# Patient Record
Sex: Female | Born: 1995 | Race: Black or African American | Hispanic: No | Marital: Single | Smoking: Never smoker
Health system: Southern US, Community
[De-identification: ages and names within clinical notes are randomized; demographics above are authoritative.]

## PROBLEM LIST (undated history)

## (undated) DIAGNOSIS — J069 Acute upper respiratory infection, unspecified: Secondary | ICD-10-CM

## (undated) DIAGNOSIS — F32A Depression, unspecified: Secondary | ICD-10-CM

## (undated) DIAGNOSIS — L309 Dermatitis, unspecified: Secondary | ICD-10-CM

## (undated) DIAGNOSIS — T783XXA Angioneurotic edema, initial encounter: Secondary | ICD-10-CM

## (undated) DIAGNOSIS — J329 Chronic sinusitis, unspecified: Secondary | ICD-10-CM

## (undated) DIAGNOSIS — L509 Urticaria, unspecified: Secondary | ICD-10-CM

## (undated) DIAGNOSIS — Z889 Allergy status to unspecified drugs, medicaments and biological substances status: Secondary | ICD-10-CM

## (undated) DIAGNOSIS — Z87820 Personal history of traumatic brain injury: Secondary | ICD-10-CM

## (undated) DIAGNOSIS — R55 Syncope and collapse: Secondary | ICD-10-CM

## (undated) DIAGNOSIS — F329 Major depressive disorder, single episode, unspecified: Secondary | ICD-10-CM

## (undated) DIAGNOSIS — G43909 Migraine, unspecified, not intractable, without status migrainosus: Secondary | ICD-10-CM

## (undated) HISTORY — DX: Urticaria, unspecified: L50.9

## (undated) HISTORY — PX: NO PAST SURGERIES: SHX2092

## (undated) HISTORY — DX: Acute upper respiratory infection, unspecified: J06.9

## (undated) HISTORY — DX: Major depressive disorder, single episode, unspecified: F32.9

## (undated) HISTORY — DX: Migraine, unspecified, not intractable, without status migrainosus: G43.909

## (undated) HISTORY — DX: Syncope and collapse: R55

## (undated) HISTORY — DX: Angioneurotic edema, initial encounter: T78.3XXA

## (undated) HISTORY — DX: Personal history of traumatic brain injury: Z87.820

## (undated) HISTORY — DX: Depression, unspecified: F32.A

## (undated) HISTORY — DX: Dermatitis, unspecified: L30.9

---
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
## 2014-07-24 ENCOUNTER — Emergency Department (HOSPITAL_BASED_OUTPATIENT_CLINIC_OR_DEPARTMENT_OTHER)
Admission: EM | Admit: 2014-07-24 | Discharge: 2014-07-24 | Disposition: A | Discharge status: Home / Self Care | Attending: Emergency Medicine | Admitting: Emergency Medicine

## 2014-07-24 ENCOUNTER — Encounter (HOSPITAL_BASED_OUTPATIENT_CLINIC_OR_DEPARTMENT_OTHER): Payer: Self-pay | Admitting: *Deleted

## 2014-07-24 DIAGNOSIS — R05 Cough: Secondary | ICD-10-CM | POA: Diagnosis present

## 2014-07-24 DIAGNOSIS — Z7951 Long term (current) use of inhaled steroids: Secondary | ICD-10-CM | POA: Insufficient documentation

## 2014-07-24 DIAGNOSIS — Z792 Long term (current) use of antibiotics: Secondary | ICD-10-CM | POA: Insufficient documentation

## 2014-07-24 DIAGNOSIS — J45909 Unspecified asthma, uncomplicated: Secondary | ICD-10-CM | POA: Insufficient documentation

## 2014-07-24 DIAGNOSIS — Z79899 Other long term (current) drug therapy: Secondary | ICD-10-CM | POA: Insufficient documentation

## 2014-07-24 DIAGNOSIS — H65192 Other acute nonsuppurative otitis media, left ear: Secondary | ICD-10-CM | POA: Insufficient documentation

## 2014-07-24 MED ORDER — AMOXICILLIN 500 MG PO CAPS
500.0000 mg | ORAL_CAPSULE | Freq: Three times a day (TID) | ORAL | Status: AC
Start: 2014-07-24 — End: 2014-08-03

## 2014-07-24 NOTE — Discharge Instructions (Signed)

## 2014-07-24 NOTE — ED Notes (Signed)
Woke with pain in her left ear. Sore throat.

## 2014-07-24 NOTE — ED Provider Notes (Signed)
CSN: [id number]     Arrival date & time 07/24/14  1110 History   First MD Initiated Contact with Patient 07/24/14 1155     Chief Complaint  Patient presents with  . URI     (Consider location/radiation/quality/duration/timing/severity/associated sxs/prior Treatment) Patient is a 18 y.o. female presenting with URI. The history is provided by the patient. No language interpreter was used.  URI Presenting symptoms: congestion and cough   Severity:  Moderate Onset quality:  Gradual Duration:  1 day Timing:  Constant Progression:  Worsening Chronicity:  New Relieved by:  Nothing Worsened by:  Nothing tried Ineffective treatments:  None tried Associated symptoms: no sinus pain   Risk factors: no chronic respiratory disease     Past Medical History  Diagnosis Date  . Asthma   . Multiple allergies   . Sinusitis    History reviewed. No pertinent past surgical history. No family history on file. History  Substance Use Topics  . Smoking status: Never Smoker   . Smokeless tobacco: Not on file  . Alcohol Use: No   OB History    No data available     Review of Systems  HENT: Positive for congestion.   Respiratory: Positive for cough.   All other systems reviewed and are negative.     Allergies  Motrin and Other  Home Medications   Prior to Admission medications   Medication Sig Start Date End Date Taking? Authorizing Provider  albuterol (PROVENTIL HFA;VENTOLIN HFA) 108 (90 BASE) MCG/ACT inhaler Inhale 2 puffs into the lungs every 6 (six) hours as needed. For shortness of breath and wheezing    Historical Provider, MD  Alcaftadine (LASTACAFT) 0.25 % SOLN Apply 1 drop to eye daily as needed. Patient uses this medication for allergies.    Historical Provider, MD  amoxicillin (AMOXIL) 500 MG capsule Take 1 capsule (500 mg total) by mouth 3 (three) times daily. 07/24/14 08/03/14  Elson AreasLeslie K Sofia, PA-C  azithromycin [facility](ZITHROMAX) 250 MG tablet Take one tablet per day until gone  08/08/12   Emilia BeckKaitlyn Szekalski, PA-C  cetirizine (ZYRTEC) 10 MG tablet Take 10 mg by mouth daily.    Historical Provider, MD  DiphenhydrAMINE HCl (BENADRYL ALLERGY PO) Take 2 capsules by mouth daily as needed. Patient used this for an allergic reaction to something.    Historical Provider, MD  FLUoxetine (PROZAC) 10 MG capsule Take 10 mg by mouth daily.    Historical Provider, MD  fluticasone (FLONASE) 50 MCG/ACT nasal spray Place 2 sprays into the nose daily.    Historical Provider, MD  olopatadine (PATANOL) 0.1 % ophthalmic solution Place 1 drop into the right eye 2 (two) times daily. Patients mom states that she had an allergic reaction to the medication.  Medicine was prescribed for the patient.    Historical Provider, MD   BP 118/53 mmHg  Pulse 55  Temp(Src) 97.7 F (36.5 C) (Oral)  Resp 20  Ht 4\' 11"  (1.499 m)  Wt 105 lb (47.628 kg)  BMI 21.20 kg/m2  SpO2 99% Physical Exam  Constitutional: She is oriented to person, place, and time. She appears well-developed and well-nourished.  HENT:  Head: Normocephalic.  Right Ear: External ear normal.  Nose: Nose normal.  Mouth/Throat: Oropharynx is clear and moist.  Left tm erythematous bulging  Eyes: Conjunctivae are normal. Pupils are equal, round, and reactive to light.  Neck: Normal range of motion.  Cardiovascular: Normal rate.   Pulmonary/Chest: Effort normal and breath sounds normal.  Abdominal: Soft.  Neurological:  She is alert and oriented to person, place, and time. She has normal reflexes.  Skin: Skin is warm.  Psychiatric: She has a normal mood and affect.  Nursing note and vitals reviewed.   ED Course  Procedures (including critical care time) Labs Review Labs Reviewed - No data to display  Imaging Review No results found.   EKG Interpretation None      MDM   Final diagnoses:  Acute nonsuppurative otitis media of left ear    amoxicillian 500 mg 30 tid    Elson AreasLeslie K Sofia, PA-C 07/24/14 1220  Ethelda ChickMartha K  Linker, MD 07/24/14 1311

## 2015-01-24 ENCOUNTER — Emergency Department (HOSPITAL_BASED_OUTPATIENT_CLINIC_OR_DEPARTMENT_OTHER)

## 2015-01-24 ENCOUNTER — Emergency Department (HOSPITAL_BASED_OUTPATIENT_CLINIC_OR_DEPARTMENT_OTHER)
Admission: EM | Admit: 2015-01-24 | Discharge: 2015-01-24 | Disposition: A | Discharge status: Home / Self Care | Attending: Emergency Medicine | Admitting: Emergency Medicine

## 2015-01-24 ENCOUNTER — Encounter (HOSPITAL_BASED_OUTPATIENT_CLINIC_OR_DEPARTMENT_OTHER): Payer: Self-pay | Admitting: Emergency Medicine

## 2015-01-24 DIAGNOSIS — W182XXA Fall in (into) shower or empty bathtub, initial encounter: Secondary | ICD-10-CM | POA: Insufficient documentation

## 2015-01-24 DIAGNOSIS — R55 Syncope and collapse: Secondary | ICD-10-CM

## 2015-01-24 DIAGNOSIS — Y998 Other external cause status: Secondary | ICD-10-CM | POA: Insufficient documentation

## 2015-01-24 DIAGNOSIS — Y9289 Other specified places as the place of occurrence of the external cause: Secondary | ICD-10-CM | POA: Diagnosis not present

## 2015-01-24 DIAGNOSIS — Y9389 Activity, other specified: Secondary | ICD-10-CM | POA: Insufficient documentation

## 2015-01-24 DIAGNOSIS — W01198A Fall on same level from slipping, tripping and stumbling with subsequent striking against other object, initial encounter: Secondary | ICD-10-CM | POA: Insufficient documentation

## 2015-01-24 DIAGNOSIS — Z7951 Long term (current) use of inhaled steroids: Secondary | ICD-10-CM | POA: Insufficient documentation

## 2015-01-24 DIAGNOSIS — Z79899 Other long term (current) drug therapy: Secondary | ICD-10-CM | POA: Diagnosis not present

## 2015-01-24 DIAGNOSIS — S0990XA Unspecified injury of head, initial encounter: Secondary | ICD-10-CM | POA: Insufficient documentation

## 2015-01-24 DIAGNOSIS — J45909 Unspecified asthma, uncomplicated: Secondary | ICD-10-CM | POA: Insufficient documentation

## 2015-01-24 DIAGNOSIS — Z8679 Personal history of other diseases of the circulatory system: Secondary | ICD-10-CM | POA: Diagnosis not present

## 2015-01-24 LAB — BASIC METABOLIC PANEL
ANION GAP: 9 (ref 5–15)
BUN: 15 mg/dL (ref 6–20)
CALCIUM: 9.9 mg/dL (ref 8.9–10.3)
CO2: 24 mmol/L (ref 22–32)
Chloride: 105 mmol/L (ref 101–111)
Creatinine, Ser: 0.8 mg/dL (ref 0.44–1.00)
Glucose, Bld: 94 mg/dL (ref 65–99)
Potassium: 4 mmol/L (ref 3.5–5.1)
SODIUM: 138 mmol/L (ref 135–145)

## 2015-01-24 LAB — CBC WITH DIFFERENTIAL/PLATELET
BASOS ABS: 0 10*3/uL (ref 0.0–0.1)
BASOS PCT: 0 % (ref 0–1)
Eosinophils Absolute: 0.4 10*3/uL (ref 0.0–0.7)
Eosinophils Relative: 8 % — ABNORMAL HIGH (ref 0–5)
HEMATOCRIT: 41 % (ref 36.0–46.0)
Hemoglobin: 13.6 g/dL (ref 12.0–15.0)
LYMPHS PCT: 37 % (ref 12–46)
Lymphs Abs: 2 10*3/uL (ref 0.7–4.0)
MCH: 30 pg (ref 26.0–34.0)
MCHC: 33.2 g/dL (ref 30.0–36.0)
MCV: 90.3 fL (ref 78.0–100.0)
MONO ABS: 0.6 10*3/uL (ref 0.1–1.0)
MONOS PCT: 11 % (ref 3–12)
Neutro Abs: 2.4 10*3/uL (ref 1.7–7.7)
Neutrophils Relative %: 44 % (ref 43–77)
PLATELETS: 283 10*3/uL (ref 150–400)
RBC: 4.54 MIL/uL (ref 3.87–5.11)
RDW: 12.4 % (ref 11.5–15.5)
WBC: 5.5 10*3/uL (ref 4.0–10.5)

## 2015-01-24 NOTE — Discharge Instructions (Signed)
Follow-up with your neurologist if your symptoms persist.   Syncope Syncope is a medical term for fainting or passing out. This means you lose consciousness and drop to the ground. People are generally unconscious for less than 5 minutes. You may have some muscle twitches for up to 15 seconds before waking up and returning to normal. Syncope occurs more often in older adults, but it can happen to anyone. While most causes of syncope are not dangerous, syncope can be a sign of a serious medical problem. It is important to seek medical care.  CAUSES  Syncope is caused by a sudden drop in blood flow to the brain. The specific cause is often not determined. Factors that can bring on syncope include:  Taking medicines that lower blood pressure.  Sudden changes in posture, such as standing up quickly.  Taking more medicine than prescribed.  Standing in one place for too long.  Seizure disorders.  Dehydration and excessive exposure to heat.  Low blood sugar (hypoglycemia).  Straining to have a bowel movement.  Heart disease, irregular heartbeat, or other circulatory problems.  Fear, emotional distress, seeing blood, or severe pain. SYMPTOMS  Right before fainting, you may:  Feel dizzy or light-headed.  Feel nauseous.  See all white or all black in your field of vision.  Have cold, clammy skin. DIAGNOSIS  Your health care provider will ask about your symptoms, perform a physical exam, and perform an electrocardiogram (ECG) to record the electrical activity of your heart. Your health care provider may also perform other heart or blood tests to determine the cause of your syncope which may include:  Transthoracic echocardiogram (TTE). During echocardiography, sound waves are used to evaluate how blood flows through your heart.  Transesophageal echocardiogram (TEE).  Cardiac monitoring. This allows your health care provider to monitor your heart rate and rhythm in real  time.  Holter monitor. This is a portable device that records your heartbeat and can help diagnose heart arrhythmias. It allows your health care provider to track your heart activity for several days, if needed.  Stress tests by exercise or by giving medicine that makes the heart beat faster. TREATMENT  In most cases, no treatment is needed. Depending on the cause of your syncope, your health care provider may recommend changing or stopping some of your medicines. HOME CARE INSTRUCTIONS  Have someone stay with you until you feel stable.  Do not drive, use machinery, or play sports until your health care provider says it is okay.  Keep all follow-up appointments as directed by your health care provider.  Lie down right away if you start feeling like you might faint. Breathe deeply and steadily. Wait until all the symptoms have passed.  Drink enough fluids to keep your urine clear or pale yellow.  If you are taking blood pressure or heart medicine, get up slowly and take several minutes to sit and then stand. This can reduce dizziness. SEEK IMMEDIATE MEDICAL CARE IF:   You have a severe headache.  You have unusual pain in the chest, abdomen, or back.  You are bleeding from your mouth or rectum, or you have black or tarry stool.  You have an irregular or very fast heartbeat.  You have pain with breathing.  You have repeated fainting or seizure-like jerking during an episode.  You faint when sitting or lying down.  You have confusion.  You have trouble walking.  You have severe weakness.  You have vision problems. If you fainted, call  your local emergency services (911 in [place] Do not drive yourself to the hospital.  MAKE SURE YOU:  Understand these instructions.  Will watch your condition.  Will get help right away if you are not doing well or get worse. Document Released: 08/17/2005 Document Revised: 08/22/2013 Document Reviewed: 10/16/2011 [facility] Patient  Information 2015 [employer] [place]. This information is not intended to replace advice given to you by your health care provider. Make sure you discuss any questions you have with your health care provider.  Head Injury You have received a head injury. It does not appear serious at this time. Headaches and vomiting are common following head injury. It should be easy to awaken from sleeping. Sometimes it is necessary for you to stay in the emergency department for a while for observation. Sometimes admission to the hospital may be needed. After injuries such as yours, most problems occur within the first 24 hours, but side effects may occur up to 7-10 days after the injury. It is important for you to carefully monitor your condition and contact your health care provider or seek immediate medical care if there is a change in your condition. WHAT ARE THE TYPES OF HEAD INJURIES? Head injuries can be as minor as a bump. Some head injuries can be more severe. More severe head injuries include:  A jarring injury to the brain (concussion).  A bruise of the brain (contusion). This mean there is bleeding in the brain that can cause swelling.  A cracked skull (skull fracture).  Bleeding in the brain that collects, clots, and forms a bump (hematoma). WHAT CAUSES A HEAD INJURY? A serious head injury is most likely to happen to someone who is in a car wreck and is not wearing a seat belt. Other causes of major head injuries include bicycle or motorcycle accidents, sports injuries, and falls. HOW ARE HEAD INJURIES DIAGNOSED? A complete history of the event leading to the injury and your current symptoms will be helpful in diagnosing head injuries. Many times, pictures of the brain, such as CT or MRI are needed to see the extent of the injury. Often, an overnight hospital stay is necessary for observation.  WHEN SHOULD I SEEK IMMEDIATE MEDICAL CARE?  You should get help right away if:  You have confusion or  drowsiness.  You feel sick to your stomach (nauseous) or have continued, forceful vomiting.  You have dizziness or unsteadiness that is getting worse.  You have severe, continued headaches not relieved by medicine. Only take over-the-counter or prescription medicines for pain, fever, or discomfort as directed by your health care provider.  You do not have normal function of the arms or legs or are unable to walk.  You notice changes in the black spots in the center of the colored part of your eye (pupil).  You have a clear or bloody fluid coming from your nose or ears.  You have a loss of vision. During the next 24 hours after the injury, you must stay with someone who can watch you for the warning signs. This person should contact local emergency services (911 in the [place] if you have seizures, you become unconscious, or you are unable to wake up. HOW CAN I PREVENT A HEAD INJURY IN THE FUTURE? The most important factor for preventing major head injuries is avoiding motor vehicle accidents. To minimize the potential for damage to your head, it is crucial to wear seat belts while riding in motor vehicles. Wearing helmets while bike riding and  and playing collision sports (like football) is also helpful. Also, avoiding dangerous activities around the house will further help reduce your risk of head injury.  °WHEN CAN I RETURN TO NORMAL ACTIVITIES AND ATHLETICS? °You should be reevaluated by your health care provider before returning to these activities. If you have any of the following symptoms, you should not return to activities or contact sports until 1 week after the symptoms have stopped: °· Persistent headache. °· Dizziness or vertigo. °· Poor attention and concentration. °· Confusion. °· Memory problems. °· Nausea or vomiting. °· Fatigue or tire easily. °· Irritability. °· Intolerant of bright lights or loud noises. °· Anxiety or depression. °· Disturbed sleep. °MAKE SURE YOU:  °· Understand these  instructions. °· Will watch your condition. °· Will get help right away if you are not doing well or get worse. °Document Released: 08/17/2005 Document Revised: 08/22/2013 Document Reviewed: 04/24/2013 °[employer]® Patient Information ©2015 [employer]. This information is not intended to replace advice given to you by your health care provider. Make sure you discuss any questions you have with your health care provider. ° °

## 2015-01-24 NOTE — ED Notes (Signed)
Pt states she has gotten dizzy the last Tuesday and had to get out of shower then.

## 2015-01-24 NOTE — ED Provider Notes (Signed)
CSN: [id number]     Arrival date & time 01/24/15  2056 History  This chart was scribed for Geoffery Lyons, MD by Annye Asa, ED Scribe. This patient was seen in room MH02/MH02 and the patient's care was started at 9:18 PM.    Chief Complaint  Patient presents with  . Fall   The history is provided by the patient. No language interpreter was used.     HPI Comments: Angelica Edwards is a 19 y.o. female with past medical history of migraines who presents to the Emergency Department complaining of fall. Patient explains she stepped out of the shower tonight and "blacked out," falling and hit the right side of her head; when she returned to consciousness, she noted blurred vision that resolved with time. This fall was unwitnessed. She states she felt well beforehand. Patient reports this has been a recurrent issue over the past several years - she estimates she has had 10 such episodes. She complains of a migraine at present. She denies biting any part of her mouth, neck pain.   Past Medical History  Diagnosis Date  . Asthma   . Multiple allergies   . Sinusitis    History reviewed. No pertinent past surgical history. History reviewed. No pertinent family history. History  Substance Use Topics  . Smoking status: Never Smoker   . Smokeless tobacco: Not on file  . Alcohol Use: No   OB History    No data available     Review of Systems  A complete 10 system review of systems was obtained and all systems are negative except as noted in the HPI and PMH.    Allergies  Motrin and Other  Home Medications   Prior to Admission medications   Medication Sig Start Date End Date Taking? Authorizing Provider  albuterol (PROVENTIL HFA;VENTOLIN HFA) 108 (90 BASE) MCG/ACT inhaler Inhale 2 puffs into the lungs every 6 (six) hours as needed. For shortness of breath and wheezing    Historical Provider, MD  Alcaftadine (LASTACAFT) 0.25 % SOLN Apply 1 drop to eye daily as needed. Patient uses this  medication for allergies.    Historical Provider, MD  azithromycin (ZITHROMAX) 250 MG tablet Take one tablet per day until gone 08/08/12   Emilia Beck, PA-C  cetirizine (ZYRTEC) 10 MG tablet Take 10 mg by mouth daily.    Historical Provider, MD  DiphenhydrAMINE HCl (BENADRYL ALLERGY PO) Take 2 capsules by mouth daily as needed. Patient used this for an allergic reaction to something.    Historical Provider, MD  FLUoxetine (PROZAC) 10 MG capsule Take 10 mg by mouth daily.    Historical Provider, MD  fluticasone (FLONASE) 50 MCG/ACT nasal spray Place 2 sprays into the nose daily.    Historical Provider, MD  olopatadine (PATANOL) 0.1 % ophthalmic solution Place 1 drop into the right eye 2 (two) times daily. Patients mom states that she had an allergic reaction to the medication.  Medicine was prescribed for the patient.    Historical Provider, MD   BP 119/69 mmHg  Pulse 69  Temp(Src) 98.3 F (36.8 C) (Oral)  Resp 18  Ht  (1.499 m)  Wt 115 lb (52.164 kg)  BMI 23.21 kg/m2  SpO2 100% Physical Exam  Constitutional: She is oriented to person, place, and time. She appears well-developed and well-nourished.  HENT:  Head: Normocephalic and atraumatic.  Eyes: EOM are normal. Pupils are equal, round, and reactive to light.  Neck: No tracheal deviation present.  Cardiovascular:  Normal rate, regular rhythm and normal heart sounds.   No murmur heard. Pulmonary/Chest: Effort normal and breath sounds normal. No respiratory distress.  Abdominal: Soft. Bowel sounds are normal. There is no tenderness.  Neurological: She is alert and oriented to person, place, and time. She has normal reflexes. She displays normal reflexes. No cranial nerve deficit.  Skin: Skin is warm and dry.  Psychiatric: She has a normal mood and affect. Her behavior is normal.  Nursing note and vitals reviewed.   ED Course  Procedures   DIAGNOSTIC STUDIES: Oxygen Saturation is 100% on RA, normal by my interpretation.     COORDINATION OF CARE: 9:21 PM Discussed treatment plan with pt at bedside and pt agreed to plan.   Labs Review Labs Reviewed - No data to display  Imaging Review No results found.   EKG Interpretation   Date/Time:  Thursday Jan 24 2015 21:33:12 EDT Ventricular Rate:  72 PR Interval:  132 QRS Duration: 66 QT Interval:  358 QTC Calculation: 392 R Axis:   58 Text Interpretation:  Normal sinus rhythm Normal ECG Confirmed by Beauford Lando   MD, Sufyan Meidinger ([id number]) on 01/24/2015 9:54:01 PM      MDM   Final diagnoses:  None    Patient is a 19 year old female who presents with multiple syncopal episodes over the past several months. This evening she was in the shower when she became lightheaded and passed out. She struck her head and was unconscious for several minutes. She then woke up and felt groggy and complained of blurry vision. On exam, she is neurologically intact, there are no murmurs, and she appears otherwise normal. Laboratory studies are all unremarkable, CT of the head is negative, and EKG is normal. At this point I feel as though she is appropriate for discharge. She may have some sort of vasovagal syncope and this can be worked up by her neurologist if it recurs. She is seen one for headaches in the past and I have advised them to make this appointment if she does not improve.   I personally performed the services described in this documentation, which was scribed in my presence. The recorded information has been reviewed and is accurate.       Geoffery Lyonsouglas Ryne Mctigue, MD 01/24/15 2253

## 2015-01-24 NOTE — ED Notes (Signed)
Pt states she fell in shower, hit head and had positive LOC.  Pt at home alone so she doesn't remember how long she was out

## 2015-01-25 ENCOUNTER — Emergency Department (HOSPITAL_COMMUNITY)
Admission: EM | Admit: 2015-01-25 | Discharge: 2015-01-26 | Disposition: A | Discharge status: Home / Self Care | Attending: Emergency Medicine | Admitting: Emergency Medicine

## 2015-01-25 ENCOUNTER — Encounter (HOSPITAL_COMMUNITY): Payer: Self-pay | Admitting: *Deleted

## 2015-01-25 DIAGNOSIS — S060X0A Concussion without loss of consciousness, initial encounter: Secondary | ICD-10-CM | POA: Insufficient documentation

## 2015-01-25 DIAGNOSIS — R110 Nausea: Secondary | ICD-10-CM | POA: Insufficient documentation

## 2015-01-25 DIAGNOSIS — Y929 Unspecified place or not applicable: Secondary | ICD-10-CM | POA: Insufficient documentation

## 2015-01-25 DIAGNOSIS — R5383 Other fatigue: Secondary | ICD-10-CM | POA: Diagnosis not present

## 2015-01-25 DIAGNOSIS — W01198A Fall on same level from slipping, tripping and stumbling with subsequent striking against other object, initial encounter: Secondary | ICD-10-CM | POA: Insufficient documentation

## 2015-01-25 DIAGNOSIS — R002 Palpitations: Secondary | ICD-10-CM | POA: Insufficient documentation

## 2015-01-25 DIAGNOSIS — Z79899 Other long term (current) drug therapy: Secondary | ICD-10-CM | POA: Diagnosis not present

## 2015-01-25 DIAGNOSIS — Y939 Activity, unspecified: Secondary | ICD-10-CM | POA: Insufficient documentation

## 2015-01-25 DIAGNOSIS — J45909 Unspecified asthma, uncomplicated: Secondary | ICD-10-CM | POA: Diagnosis not present

## 2015-01-25 DIAGNOSIS — R42 Dizziness and giddiness: Secondary | ICD-10-CM | POA: Insufficient documentation

## 2015-01-25 DIAGNOSIS — Y999 Unspecified external cause status: Secondary | ICD-10-CM | POA: Insufficient documentation

## 2015-01-25 DIAGNOSIS — S0990XA Unspecified injury of head, initial encounter: Secondary | ICD-10-CM | POA: Diagnosis present

## 2015-01-25 NOTE — ED Provider Notes (Signed)
CSN: [id number]     Arrival date & time 01/25/15  1951 History  This chart was scribed for Derwood KaplanAnkit Evangelyn Crouse, MD by Freida Busmaniana Omoyeni, ED Scribe. This patient was seen in room B15C/B15C and the patient's care was started 11:09 PM.     Chief Complaint  Patient presents with  . Headache  . Nausea   The history is provided by the patient and a parent. No language interpreter was used.     HPI Comments:  Angelica Edwards is a 19 y.o. female who presents to the Emergency Department complaining of constant constant 6/10 HA with pain to the right temporal region for 2 days. Pt reports syncopal episode and fall yesterday. Pt was in the shower when she began to feel dizzy and fell, hitting her head in the shower. Pt states she is unsure how long she lost consciousness; she also notes palpitations prior to syncope. She was seen in the ED following the incidence with a complaint of HA.  She had a CT head of her head which was negative.  At this time pt reports associated nausea and fatigue which she notes are new today. She also reports intermittent dizziness with certain movements/positions. Pt has a history of near syncope and syncopal episodes; she has been evaluated by a neurologist in the past but never received a diagnosis for her symptoms. She has taken aleve for her HA with little relief.  Pt denies tinnitus and visual disturbances at this time. She also denies h/o blood clots, recent long travel/surgery,  family history of sudden death in younger family members. Pt receives depo shots but denies use of estrogen therapy. Her neurologist is at [facility] but she has not seen them in years and no PCP at this time.    Past Medical History  Diagnosis Date  . Asthma   . Multiple allergies   . Sinusitis    History reviewed. No pertinent past surgical history. History reviewed. No pertinent family history. History  Substance Use Topics  . Smoking status: Never Smoker   . Smokeless tobacco: Not on  file  . Alcohol Use: No   OB History    No data available     Review of Systems  Eyes: Negative for visual disturbance.  Cardiovascular: Positive for palpitations.  Gastrointestinal: Positive for nausea. Negative for vomiting.  Neurological: Positive for dizziness and headaches.  All other systems reviewed and are negative.     Allergies  Motrin and Other  Home Medications   Prior to Admission medications   Medication Sig Start Date End Date Taking? Authorizing Provider  albuterol (PROVENTIL HFA;VENTOLIN HFA) 108 (90 BASE) MCG/ACT inhaler Inhale 2 puffs into the lungs every 6 (six) hours as needed. For shortness of breath and wheezing   Yes Historical Provider, MD  pseudoephedrine (SUDAFED) 120 MG 12 hr tablet Take 120 mg by mouth daily as needed for congestion.   Yes Historical Provider, MD  azithromycin (ZITHROMAX) 250 MG tablet Take one tablet per day until gone Patient not taking: Reported on 01/25/2015 08/08/12   Emilia BeckKaitlyn Szekalski, PA-C  olopatadine (PATANOL) 0.1 % ophthalmic solution Place 1 drop into the right eye 2 (two) times daily. Patients mom states that she had an allergic reaction to the medication.  Medicine was prescribed for the patient.    Historical Provider, MD  ondansetron (ZOFRAN ODT) 8 MG disintegrating tablet Take 1 tablet (8 mg total) by mouth every 8 (eight) hours as needed for nausea. 01/26/15   Razan Siler  Torence Palmeri, MD   BP 112/63 mmHg  Pulse 71  Temp(Src) 98.3 F (36.8 C)  Resp 16  SpO2 100% Physical Exam  Constitutional: She is oriented to person, place, and time. She appears well-developed and well-nourished. No distress.  HENT:  Head: Normocephalic and atraumatic.  Eyes: EOM are normal.  Neck:  No Carotid Bruits   Cardiovascular: Normal rate, regular rhythm and normal heart sounds.   No murmur heard. Pulmonary/Chest: Effort normal and breath sounds normal. No respiratory distress.  Abdominal: She exhibits no distension.  Musculoskeletal: She  exhibits no edema.  BLE no pitting edema; no unilateral swelling  Neurological: She is alert and oriented to person, place, and time. No cranial nerve deficit.  CN 2-12 appear intact   Skin: Skin is warm and dry.  Psychiatric: She has a normal mood and affect.  Nursing note and vitals reviewed.   ED Course  Procedures   DIAGNOSTIC STUDIES:  Oxygen Saturation is 100% on RA, normal by my interpretation.    COORDINATION OF CARE:  11:17 PM Will order pain and nausea meds. Advised pt to continue NSAIDs every 4-6 hrs at home. Discussed treatment plan with pt at bedside and pt agreed to plan.  Labs Review Labs Reviewed - No data to display  Imaging Review No results found.   EKG Interpretation None      MDM   Final diagnoses:  Concussion, without loss of consciousness, initial encounter    I personally performed the services described in this documentation, which was scribed in my presence. The recorded information has been reviewed and is accurate.  Pt with headache, nausea, dizziness. Sx post fall. Already has a neg CT head. No focal neuro deficit grossly - will d/c.   Derwood Kaplan, MD 01/29/15 [phone]

## 2015-01-25 NOTE — ED Notes (Signed)
Pt seen here yesterday for syncope. Pt states yesterday she fell in the shower hitting her head on the tub. Pt states her headache has not been relieved from prescription medications. Pt also reports nausea with the headache. Pt reports decrease in energy today.

## 2015-01-26 DIAGNOSIS — S060X0A Concussion without loss of consciousness, initial encounter: Secondary | ICD-10-CM | POA: Diagnosis not present

## 2015-01-26 MED ORDER — ONDANSETRON 8 MG PO TBDP: 8.0000 mg | ORAL_TABLET | Freq: Three times a day (TID) | ORAL | Status: DC | PRN

## 2015-01-26 MED ORDER — HYDROCODONE-ACETAMINOPHEN 5-325 MG PO TABS
1.0000 | ORAL_TABLET | Freq: Once | ORAL | Status: AC
  Administered 2015-01-26: 1 via ORAL
  Filled 2015-01-26: qty 1

## 2015-01-26 MED ORDER — ONDANSETRON 4 MG PO TBDP
4.0000 mg | ORAL_TABLET | Freq: Once | ORAL | Status: AC
  Administered 2015-01-26: 4 mg via ORAL
  Filled 2015-01-26: qty 1

## 2015-01-26 NOTE — Discharge Instructions (Signed)
We saw you in the ER for headaches.  We think you have concussion headaches and there appears to be no evidence of infection, bleeds or tumors based on our exam and results.  Please take motrin round the clock for the next 6 hours, and take other meds prescribed only for break through pain. See your doctor if the pain persists, as you might need better medications or a specialist.   Concussion A concussion, or closed-head injury, is a brain injury caused by a direct blow to the head or by a quick and sudden movement (jolt) of the head or neck. Concussions are usually not life-threatening. Even so, the effects of a concussion can be serious. If you have had a concussion before, you are more likely to experience concussion-like symptoms after a direct blow to the head.  CAUSES  Direct blow to the head, such as from running into another player during a soccer game, being hit in a fight, or hitting your head on a hard surface.  A jolt of the head or neck that causes the brain to move back and forth inside the skull, such as in a car crash. SIGNS AND SYMPTOMS The signs of a concussion can be hard to notice. Early on, they may be missed by you, family members, and health care providers. You may look fine but act or feel differently. Symptoms are usually temporary, but they may last for days, weeks, or even longer. Some symptoms may appear right away while others may not show up for hours or days. Every head injury is different. Symptoms include:  Mild to moderate headaches that will not go away.  A feeling of pressure inside your head.  Having more trouble than usual:  Learning or remembering things you have heard.  Answering questions.  Paying attention or concentrating.  Organizing daily tasks.  Making decisions and solving problems.  Slowness in thinking, acting or reacting, speaking, or reading.  Getting lost or being easily confused.  Feeling tired all the time or lacking energy  (fatigued).  Feeling drowsy.  Sleep disturbances.  Sleeping more than usual.  Sleeping less than usual.  Trouble falling asleep.  Trouble sleeping (insomnia).  Loss of balance or feeling lightheaded or dizzy.  Nausea or vomiting.  Numbness or tingling.  Increased sensitivity to:  Sounds.  Lights.  Distractions.  Vision problems or eyes that tire easily.  Diminished sense of taste or smell.  Ringing in the ears.  Mood changes such as feeling sad or anxious.  Becoming easily irritated or angry for little or no reason.  Lack of motivation.  Seeing or hearing things other people do not see or hear (hallucinations). DIAGNOSIS Your health care provider can usually diagnose a concussion based on a description of your injury and symptoms. He or she will ask whether you passed out (lost consciousness) and whether you are having trouble remembering events that happened right before and during your injury. Your evaluation might include:  A brain scan to look for signs of injury to the brain. Even if the test shows no injury, you may still have a concussion.  Blood tests to be sure other problems are not present. TREATMENT  Concussions are usually treated in an emergency department, in urgent care, or at a clinic. You may need to stay in the hospital overnight for further treatment.  Tell your health care provider if you are taking any medicines, including prescription medicines, over-the-counter medicines, and natural remedies. Some medicines, such as blood thinners (  anticoagulants) and aspirin, may increase the chance of complications. Also tell your health care provider whether you have had alcohol or are taking illegal drugs. This information may affect treatment.  Your health care provider will send you home with important instructions to follow.  How fast you will recover from a concussion depends on many factors. These factors include how severe your concussion is,  what part of your brain was injured, your age, and how healthy you were before the concussion.  Most people with mild injuries recover fully. Recovery can take time. In general, recovery is slower in older persons. Also, persons who have had a concussion in the past or have other medical problems may find that it takes longer to recover from their current injury. HOME CARE INSTRUCTIONS General Instructions  Carefully follow the directions your health care provider gave you.  Only take over-the-counter or prescription medicines for pain, discomfort, or fever as directed by your health care provider.  Take only those medicines that your health care provider has approved.  Do not drink alcohol until your health care provider says you are well enough to do so. Alcohol and certain other drugs may slow your recovery and can put you at risk of further injury.  If it is harder than usual to remember things, write them down.  If you are easily distracted, try to do one thing at a time. For example, do not try to watch TV while fixing dinner.  Talk with family members or close friends when making important decisions.  Keep all follow-up appointments. Repeated evaluation of your symptoms is recommended for your recovery.  Watch your symptoms and tell others to do the same. Complications sometimes occur after a concussion. Older adults with a brain injury may have a higher risk of serious complications, such as a blood clot on the brain.  Tell your teachers, school nurse, school counselor, coach, athletic trainer, or work Production designer, theatre/television/film about your injury, symptoms, and restrictions. Tell them about what you can or cannot do. They should watch for:  Increased problems with attention or concentration.  Increased difficulty remembering or learning new information.  Increased time needed to complete tasks or assignments.  Increased irritability or decreased ability to cope with stress.  Increased  symptoms.  Rest. Rest helps the brain to heal. Make sure you:  Get plenty of sleep at night. Avoid staying up late at night.  Keep the same bedtime hours on weekends and weekdays.  Rest during the day. Take daytime naps or rest breaks when you feel tired.  Limit activities that require a lot of thought or concentration. These include:  Doing homework or job-related work.  Watching TV.  Working on the computer.  Avoid any situation where there is potential for another head injury (football, hockey, soccer, basketball, martial arts, downhill snow sports and horseback riding). Your condition will get worse every time you experience a concussion. You should avoid these activities until you are evaluated by the appropriate follow-up health care providers. Returning To Your Regular Activities You will need to return to your normal activities slowly, not all at once. You must give your body and brain enough time for recovery.  Do not return to sports or other athletic activities until your health care provider tells you it is safe to do so.  Ask your health care provider when you can drive, ride a bicycle, or operate heavy machinery. Your ability to react may be slower after a brain injury. Never do these activities  if you are dizzy.  Ask your health care provider about when you can return to work or school. Preventing Another Concussion It is very important to avoid another brain injury, especially before you have recovered. In rare cases, another injury can lead to permanent brain damage, brain swelling, or death. The risk of this is greatest during the first 7-10 days after a head injury. Avoid injuries by:  Wearing a seat belt when riding in a car.  Drinking alcohol only in moderation.  Wearing a helmet when biking, skiing, skateboarding, skating, or doing similar activities.  Avoiding activities that could lead to a second concussion, such as contact or recreational sports, until  your health care provider says it is okay.  Taking safety measures in your home.  Remove clutter and tripping hazards from floors and stairways.  Use grab bars in bathrooms and handrails by stairs.  Place non-slip mats on floors and in bathtubs.  Improve lighting in dim areas. SEEK MEDICAL CARE IF:  You have increased problems paying attention or concentrating.  You have increased difficulty remembering or learning new information.  You need more time to complete tasks or assignments than before.  You have increased irritability or decreased ability to cope with stress.  You have more symptoms than before. Seek medical care if you have any of the following symptoms for more than 2 weeks after your injury:  Lasting (chronic) headaches.  Dizziness or balance problems.  Nausea.  Vision problems.  Increased sensitivity to noise or light.  Depression or mood swings.  Anxiety or irritability.  Memory problems.  Difficulty concentrating or paying attention.  Sleep problems.  Feeling tired all the time. SEEK IMMEDIATE MEDICAL CARE IF:  You have severe or worsening headaches. These may be a sign of a blood clot in the brain.  You have weakness (even if only in one hand, leg, or part of the face).  You have numbness.  You have decreased coordination.  You vomit repeatedly.  You have increased sleepiness.  One pupil is larger than the other.  You have convulsions.  You have slurred speech.  You have increased confusion. This may be a sign of a blood clot in the brain.  You have increased restlessness, agitation, or irritability.  You are unable to recognize people or places.  You have neck pain.  It is difficult to wake you up.  You have unusual behavior changes.  You lose consciousness. MAKE SURE YOU:  Understand these instructions.  Will watch your condition.  Will get help right away if you are not doing well or get worse. Document  Released: 11/07/2003 Document Revised: 08/22/2013 Document Reviewed: 03/09/2013 [facility] Patient Information 2015 [place], [place]. This information is not intended to replace advice given to you by your health care provider. Make sure you discuss any questions you have with your health care provider.

## 2015-02-19 ENCOUNTER — Emergency Department (HOSPITAL_BASED_OUTPATIENT_CLINIC_OR_DEPARTMENT_OTHER)
Admission: EM | Admit: 2015-02-19 | Discharge: 2015-02-19 | Disposition: A | Discharge status: Home / Self Care | Attending: Emergency Medicine | Admitting: Emergency Medicine

## 2015-02-19 ENCOUNTER — Encounter (HOSPITAL_BASED_OUTPATIENT_CLINIC_OR_DEPARTMENT_OTHER): Payer: Self-pay | Admitting: Emergency Medicine

## 2015-02-19 ENCOUNTER — Emergency Department (HOSPITAL_BASED_OUTPATIENT_CLINIC_OR_DEPARTMENT_OTHER)

## 2015-02-19 DIAGNOSIS — Z3202 Encounter for pregnancy test, result negative: Secondary | ICD-10-CM | POA: Insufficient documentation

## 2015-02-19 DIAGNOSIS — Y9389 Activity, other specified: Secondary | ICD-10-CM | POA: Insufficient documentation

## 2015-02-19 DIAGNOSIS — Z79899 Other long term (current) drug therapy: Secondary | ICD-10-CM | POA: Insufficient documentation

## 2015-02-19 DIAGNOSIS — Y9289 Other specified places as the place of occurrence of the external cause: Secondary | ICD-10-CM | POA: Insufficient documentation

## 2015-02-19 DIAGNOSIS — J45909 Unspecified asthma, uncomplicated: Secondary | ICD-10-CM | POA: Diagnosis not present

## 2015-02-19 DIAGNOSIS — S8992XA Unspecified injury of left lower leg, initial encounter: Secondary | ICD-10-CM | POA: Insufficient documentation

## 2015-02-19 DIAGNOSIS — Z792 Long term (current) use of antibiotics: Secondary | ICD-10-CM | POA: Insufficient documentation

## 2015-02-19 DIAGNOSIS — Y9339 Activity, other involving climbing, rappelling and jumping off: Secondary | ICD-10-CM | POA: Diagnosis not present

## 2015-02-19 DIAGNOSIS — Y998 Other external cause status: Secondary | ICD-10-CM | POA: Diagnosis not present

## 2015-02-19 DIAGNOSIS — X58XXXA Exposure to other specified factors, initial encounter: Secondary | ICD-10-CM | POA: Insufficient documentation

## 2015-02-19 LAB — PREGNANCY, URINE: PREG TEST UR: NEGATIVE

## 2015-02-19 NOTE — Discharge Instructions (Signed)
Follow-up with her physician in 48 hours, as scheduled.  Motrin as needed  Intermittent ice as needed

## 2015-02-19 NOTE — ED Notes (Signed)
Pt in due to her primary wanting x-ray of her L leg. States previous injury to leg several years ago, and she went rock climbing and the leg started hurting and swelling again.

## 2015-02-19 NOTE — ED Notes (Signed)
Patient transported to X-ray 

## 2015-02-19 NOTE — ED Notes (Signed)
MD James at bedside.  

## 2015-02-19 NOTE — ED Provider Notes (Signed)
CSN: [id number]     Arrival date & time 02/19/15  1959 History  This chart was scribed for Rolland Porter, MD by Octavia Heir, ED Scribe. This patient was seen in room [facility]HT13/[facility]13 and the patient's care was started at 8:43 PM.    Chief Complaint  Patient presents with  . Leg Pain      The history is provided by the patient. No language interpreter was used.    HPI Comments: Angelica Edwards is a 19 y.o. female who presents to the Emergency Department complaining of a gradual worsening left knee injury onset few days ago. Pt notes she was rock climbing and she jumped wrong and landed knees slightly bent had some pain. Pt notes having a prior injury to same leg a few years ago and notes she tore a ligament and had to wear a brace. Pt notes being ambulatory for a long period time makes the pain worse.  Past Medical History  Diagnosis Date  . Asthma   . Multiple allergies   . Sinusitis    History reviewed. No pertinent past surgical history. History reviewed. No pertinent family history. History  Substance Use Topics  . Smoking status: Never Smoker   . Smokeless tobacco: Not on file  . Alcohol Use: No   OB History    No data available     Review of Systems  Constitutional: Negative for fever, chills, diaphoresis, appetite change and fatigue.  HENT: Negative for mouth sores, sore throat and trouble swallowing.   Eyes: Negative for visual disturbance.  Respiratory: Negative for cough, chest tightness, shortness of breath and wheezing.   Cardiovascular: Negative for chest pain.  Gastrointestinal: Negative for nausea, vomiting, abdominal pain, diarrhea and abdominal distention.  Endocrine: Negative for polydipsia, polyphagia and polyuria.  Genitourinary: Negative for dysuria, frequency and hematuria.  Musculoskeletal: Positive for joint swelling. Negative for gait problem.  Skin: Negative for color change, pallor and rash.  Neurological: Negative for dizziness, syncope, light-headedness  and headaches.  Hematological: Does not bruise/bleed easily.  Psychiatric/Behavioral: Negative for behavioral problems and confusion.      Allergies  Motrin and Other  Home Medications   Prior to Admission medications   Medication Sig Start Date End Date Taking? Authorizing Provider  albuterol (PROVENTIL HFA;VENTOLIN HFA) 108 (90 BASE) MCG/ACT inhaler Inhale 2 puffs into the lungs every 6 (six) hours as needed. For shortness of breath and wheezing    Historical Provider, MD  azithromycin (ZITHROMAX) 250 MG tablet Take one tablet per day until gone Patient not taking: Reported on 01/25/2015 08/08/12   Emilia Beck, PA-C  olopatadine (PATANOL) 0.1 % ophthalmic solution Place 1 drop into the right eye 2 (two) times daily. Patients mom states that she had an allergic reaction to the medication.  Medicine was prescribed for the patient.    Historical Provider, MD  ondansetron (ZOFRAN ODT) 8 MG disintegrating tablet Take 1 tablet (8 mg total) by mouth every 8 (eight) hours as needed for nausea. 01/26/15   Derwood Kaplan, MD  pseudoephedrine (SUDAFED) 120 MG 12 hr tablet Take 120 mg by mouth daily as needed for congestion.    Historical Provider, MD   Triage vitals: BP 125/66 mmHg  Pulse 80  Temp(Src) 98.3 F (36.8 C)  Resp 18  Ht  (1.499 m)  Wt 115 lb (52.164 kg)  BMI 23.21 kg/m2  SpO2 100% Physical Exam  Constitutional: She is oriented to person, place, and time. She appears well-developed and well-nourished. No distress.  HENT:  Head: Normocephalic.  Eyes: Conjunctivae are normal. Pupils are equal, round, and reactive to light. No scleral icterus.  Neck: Normal range of motion. Neck supple. No thyromegaly present.  Cardiovascular: Normal rate and regular rhythm.  Exam reveals no gallop and no friction rub.   No murmur heard. Pulmonary/Chest: Effort normal and breath sounds normal. No respiratory distress. She has no wheezes. She has no rales.  Abdominal: Soft. Bowel sounds  are normal. She exhibits no distension. There is no tenderness. There is no rebound.  Musculoskeletal: Normal range of motion.  Popliteal fossa discomfort Medial joint tenderness Medial left hamstring Negative mcmurrays  Negative lachmans  Neurological: She is alert and oriented to person, place, and time.  Skin: Skin is warm and dry. No rash noted.  Psychiatric: She has a normal mood and affect. Her behavior is normal.  Nursing note and vitals reviewed.   ED Course  Procedures  DIAGNOSTIC STUDIES: Oxygen Saturation is 100% on RA, normal by my interpretation.  COORDINATION OF CARE:  8:47 PM Discussed treatment plan which includes check labs with pt at bedside and pt agreed to plan.  Labs Review Labs Reviewed  PREGNANCY, URINE    Imaging Review Dg Knee Complete 4 Views Left  02/19/2015   CLINICAL DATA:  Patient with injury to the knee while rock climbing.  EXAM: LEFT KNEE - COMPLETE 4+ VIEW  COMPARISON:  None.  FINDINGS: There is no evidence of fracture, dislocation, or joint effusion. There is no evidence of arthropathy or other focal bone abnormality. Soft tissues are unremarkable.  IMPRESSION: Negative.   Electronically Signed   By: Annia Belt M.D.   On: 02/19/2015 20:49     EKG Interpretation None      MDM   Final diagnoses:  Knee injury, left, initial encounter    I personally performed the services described in this documentation, which was scribed in my presence. The recorded information has been reviewed and is accurate.   No exam or x-ray findings suggestive of internal derangement of the knee. She is not limping. Plan is expectant management. Primary care follow-up. INSAIDs when necessary.  Rolland Porter, MD 02/19/15 2127

## 2016-06-23 ENCOUNTER — Emergency Department (HOSPITAL_BASED_OUTPATIENT_CLINIC_OR_DEPARTMENT_OTHER)
Admission: EM | Admit: 2016-06-23 | Discharge: 2016-06-23 | Disposition: A | Discharge status: Home / Self Care | Attending: Emergency Medicine | Admitting: Emergency Medicine

## 2016-06-23 ENCOUNTER — Encounter (HOSPITAL_BASED_OUTPATIENT_CLINIC_OR_DEPARTMENT_OTHER): Payer: Self-pay | Admitting: *Deleted

## 2016-06-23 DIAGNOSIS — W268XXA Contact with other sharp object(s), not elsewhere classified, initial encounter: Secondary | ICD-10-CM | POA: Diagnosis not present

## 2016-06-23 DIAGNOSIS — S0990XA Unspecified injury of head, initial encounter: Secondary | ICD-10-CM | POA: Diagnosis present

## 2016-06-23 DIAGNOSIS — Y929 Unspecified place or not applicable: Secondary | ICD-10-CM | POA: Insufficient documentation

## 2016-06-23 DIAGNOSIS — Z79899 Other long term (current) drug therapy: Secondary | ICD-10-CM | POA: Insufficient documentation

## 2016-06-23 DIAGNOSIS — Y999 Unspecified external cause status: Secondary | ICD-10-CM | POA: Insufficient documentation

## 2016-06-23 DIAGNOSIS — S0001XA Abrasion of scalp, initial encounter: Secondary | ICD-10-CM | POA: Diagnosis not present

## 2016-06-23 DIAGNOSIS — Y9389 Activity, other specified: Secondary | ICD-10-CM | POA: Diagnosis not present

## 2016-06-23 DIAGNOSIS — J45909 Unspecified asthma, uncomplicated: Secondary | ICD-10-CM | POA: Diagnosis not present

## 2016-06-23 NOTE — ED Triage Notes (Signed)
Here mother was taking her hair weave out tonight and saw a hole in her scalp about 1" in diameter.

## 2016-06-23 NOTE — Discharge Instructions (Signed)
Keep the scalp clean. Follow up with a primary care provider for future concerns.

## 2016-06-23 NOTE — ED Provider Notes (Signed)
[facility]   CSN: [id number] Arrival date & time: 06/23/16  2207  By signing my name below, I, Soijett Blue, attest that this documentation has been prepared under the direction and in the presence of Dnya Hickle, PA-C Electronically Signed: Soijett Blue, ED Scribe. 06/23/16. 10:43 PM.   History   Chief Complaint Chief Complaint  Patient presents with  . Skin Ulcer    HPI Angelica Edwards is a 20 y.o. female who presents to the Emergency Department complaining of a scalp problem noticed tonight. Mother notes that the pt had a hair weave placed 3 weeks ago and her mother removed the hair weave tonight and noticed an "opening" to her scalp. Mother denies the area oozing or bleeding when she first noticed it. Pt reports that the area is non-painful. Denies trauma or other complaints.   The history is provided by the patient and a parent. No language interpreter was used.    Past Medical History:  Diagnosis Date  . Asthma   . Multiple allergies   . Sinusitis     There are no active problems to display for this patient.   History reviewed. No pertinent surgical history.  OB History    No data available       Home Medications    Prior to Admission medications   Medication Sig Start Date End Date Taking? Authorizing Provider  albuterol (PROVENTIL HFA;VENTOLIN HFA) 108 (90 BASE) MCG/ACT inhaler Inhale 2 puffs into the lungs every 6 (six) hours as needed. For shortness of breath and wheezing   Yes Historical Provider, MD  azithromycin (ZITHROMAX) 250 MG tablet Take one tablet per day until gone Patient not taking: Reported on 01/25/2015 08/08/12   Emilia BeckKaitlyn Szekalski, PA-C  olopatadine (PATANOL) 0.1 % ophthalmic solution Place 1 drop into the right eye 2 (two) times daily. Patients mom states that she had an allergic reaction to the medication.  Medicine was prescribed for the patient.    Historical Provider, MD  ondansetron (ZOFRAN ODT) 8 MG disintegrating  tablet Take 1 tablet (8 mg total) by mouth every 8 (eight) hours as needed for nausea. 01/26/15   Derwood KaplanAnkit Nanavati, MD  pseudoephedrine (SUDAFED) 120 MG 12 hr tablet Take 120 mg by mouth daily as needed for congestion.    Historical Provider, MD    Family History No family history on file.  Social History Social History  Substance Use Topics  . Smoking status: Never Smoker  . Smokeless tobacco: Never Used  . Alcohol use No     Allergies   Motrin [ibuprofen] and Other   Review of Systems Review of Systems  Constitutional: Negative for chills and fever.  HENT:       +"Opening" to top of scalp  Skin: Negative for color change and rash.     Physical Exam Updated Vital Signs BP 129/75   Pulse 64   Temp 98.1 F (36.7 C) (Oral)   Resp 18   Ht 4\' 11"  (1.499 m)   Wt 115 lb (52.2 kg)   SpO2 100%   BMI 23.23 kg/m   Physical Exam  Constitutional: She appears well-developed and well-nourished. No distress.  HENT:  Head: Normocephalic.  Some crusting noted to the central parietal scalp without lesions or wounds noted. Skin intact.   Eyes: Conjunctivae are normal.  Neck: Neck supple.  Cardiovascular: Normal rate and regular rhythm.   Pulmonary/Chest: Effort normal. No respiratory distress.  Abdominal: She exhibits no distension.  Musculoskeletal: She exhibits  no edema.  Neurological: She is alert.  Skin: Skin is warm and dry. She is not diaphoretic.  Psychiatric: She has a normal mood and affect. Her behavior is normal.  Nursing note and vitals reviewed.    ED Treatments / Results  DIAGNOSTIC STUDIES: Oxygen Saturation is 100% on RA, nl by my interpretation.    COORDINATION OF CARE: 10:39 PM Discussed treatment plan with pt at bedside and pt agreed to plan.  Procedures Procedures (including critical care time)  Medications Ordered in ED Medications - No data to display   Initial Impression / Assessment and Plan / ED Course  I have reviewed the triage vital  signs and the nursing notes.   Clinical Course    No significant abnormalities noted to the patient's scalp. Certainly no wounds or "openings" noted. Patient to follow-up with her PCP as needed.   Final Clinical Impressions(s) / ED Diagnoses   Final diagnoses:  Abrasion of scalp, initial encounter    New Prescriptions New Prescriptions   No medications on file    I personally performed the services described in this documentation, which was scribed in my presence. The recorded information has been reviewed and is accurate.    Anselm Pancoast, PA-C 06/23/16 2255    Anselm Pancoast, PA-C 06/23/16 2256    Geoffery Lyons, MD 06/23/16 2300

## 2017-01-09 IMAGING — CT CT HEAD W/O CM
1 series · 16 of 30 positions shown, 20 images · non-contrast
Comparison: None.

CLINICAL DATA: Trauma. Fell in the shower 3 hours ago. Hit the
right side of the head. Headache. Loss of consciousness.

EXAM:
CT HEAD WITHOUT CONTRAST
TECHNIQUE: Contiguous axial images were obtained from the base of the skull
through the vertex without intravenous contrast.

[Series 2: head 4.8 h37s · axial · 0.41mm/px · z∈[+1008,+1141]mm · 16 of 32 slices shown, 20 images]
[im 2/32  brain]
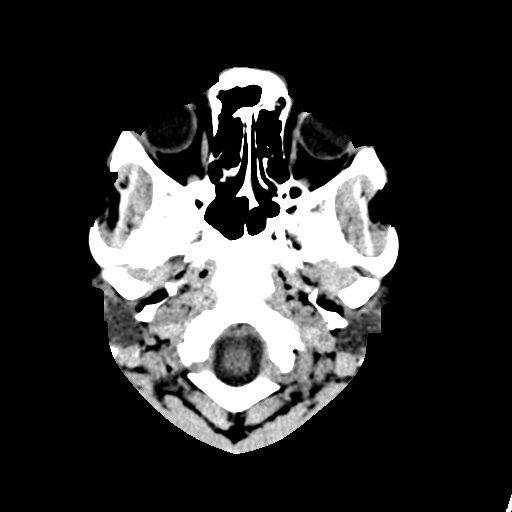
[im 2/32  bone]
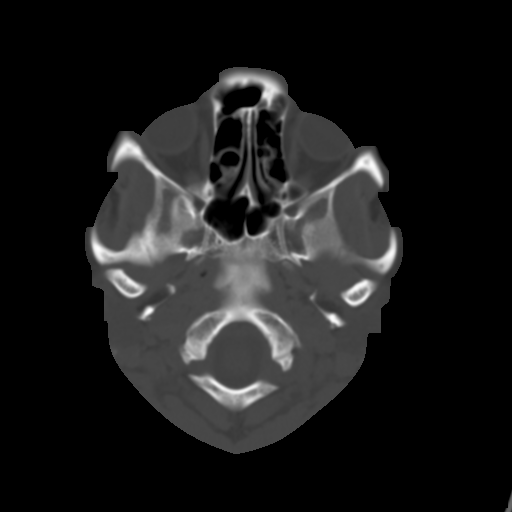
[im 4/32  brain]
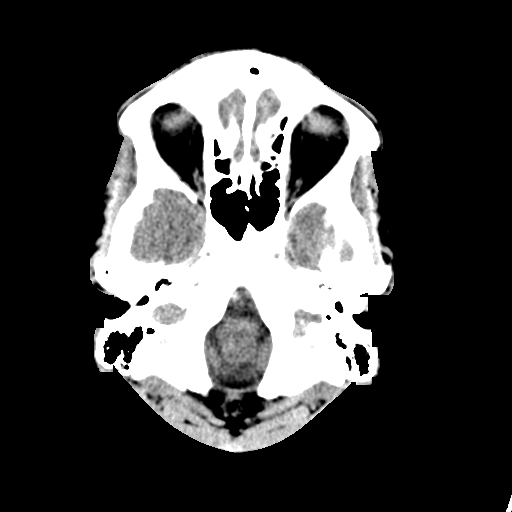
[im 6/32  brain]
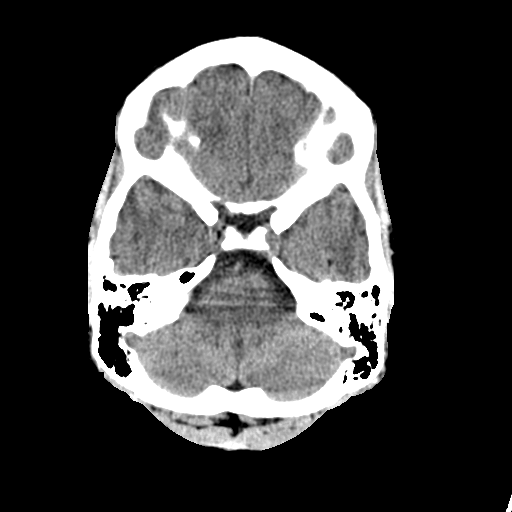
[im 8/32  brain]
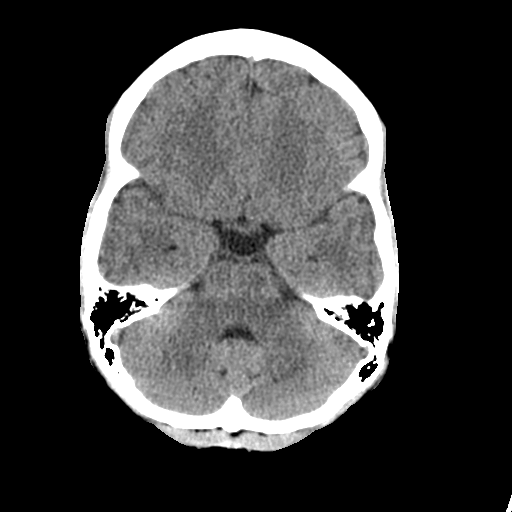
[im 9/32  brain]
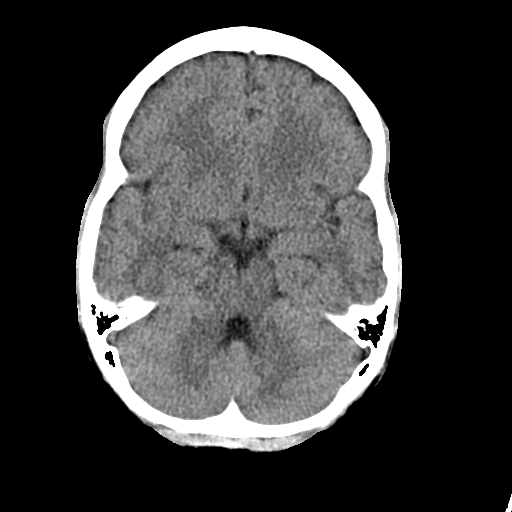
[im 9/32  bone]
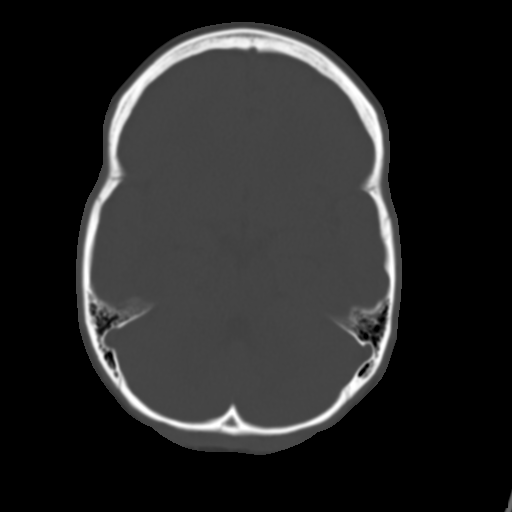
[im 11/32  brain]
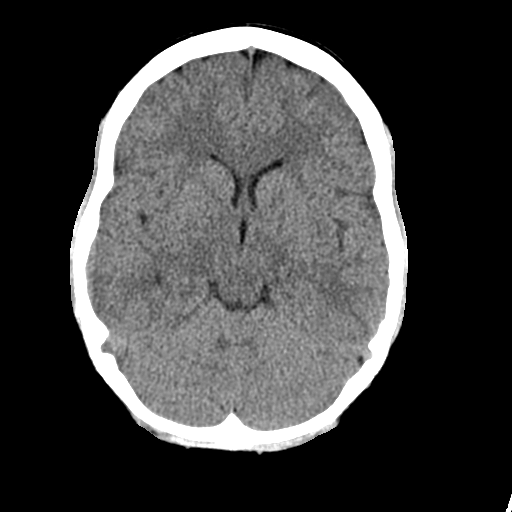
[im 13/32  brain]
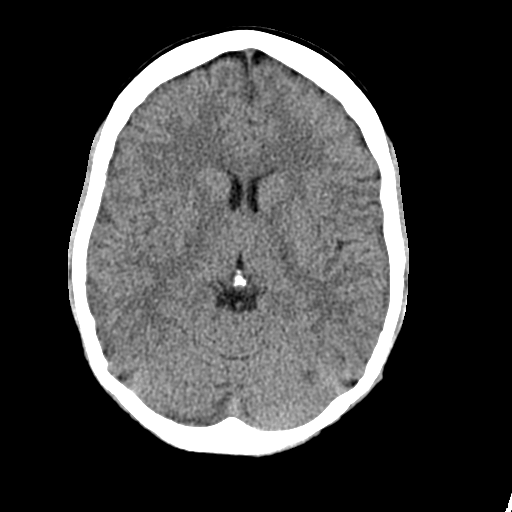
[im 15/32  brain]
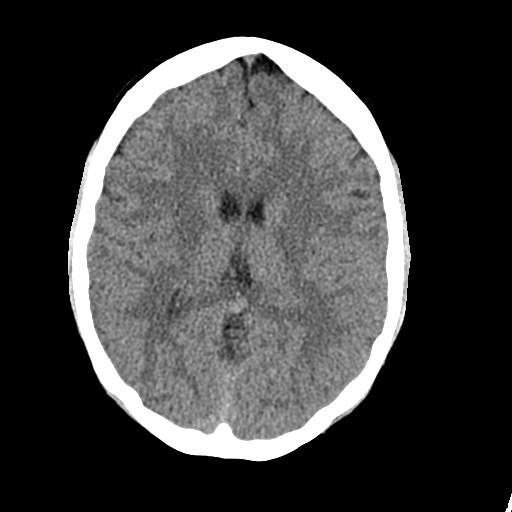
[im 17/32  brain]
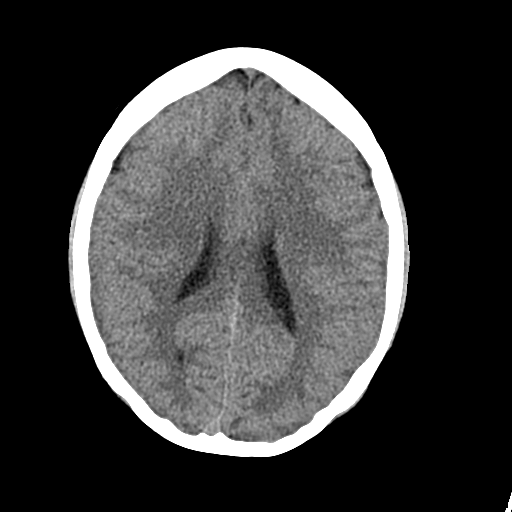
[im 17/32  bone]
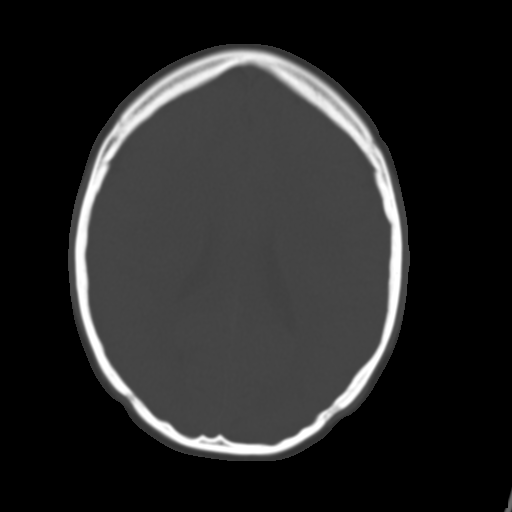
[im 19/32  brain]
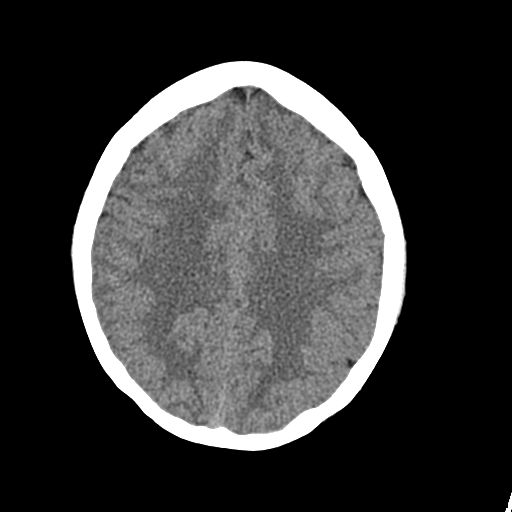
[im 21/32  brain]
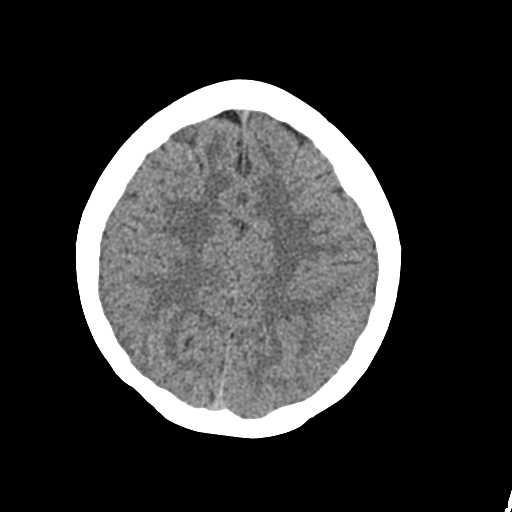
[im 23/32  brain]
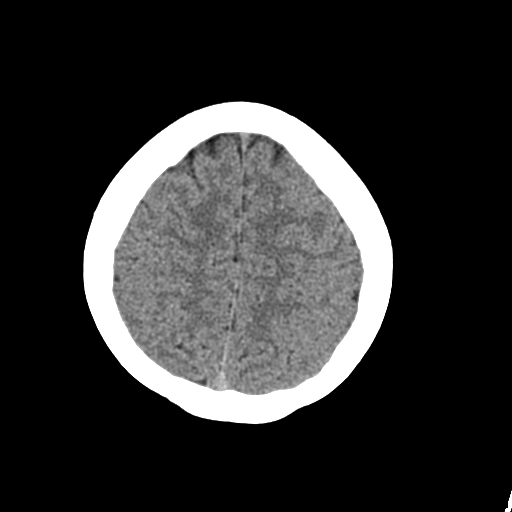
[im 24/32  brain]
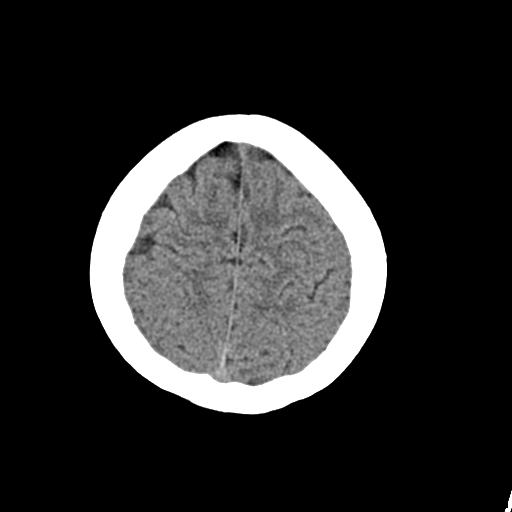
[im 24/32  bone]
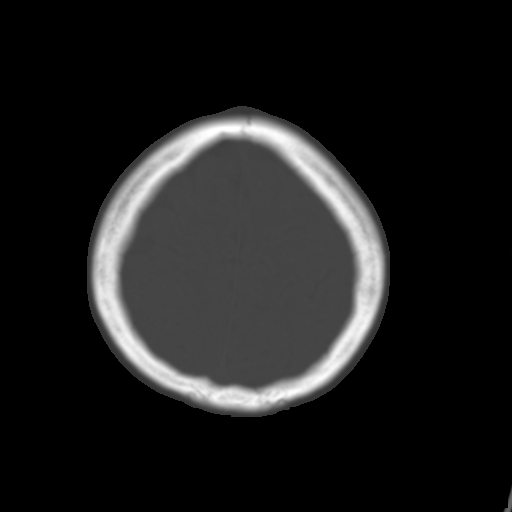
[im 26/32  brain]
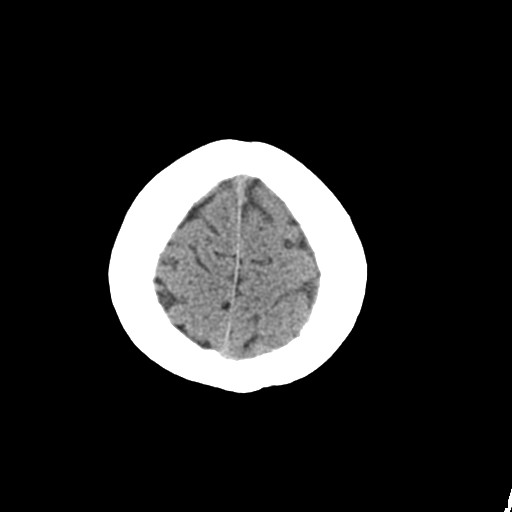
[im 28/32  brain]
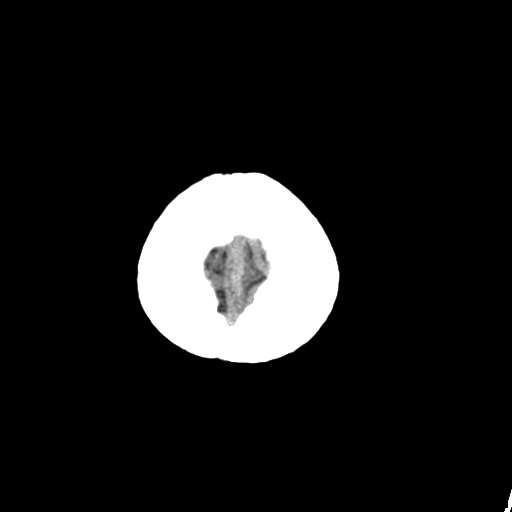
[im 30/32  brain]
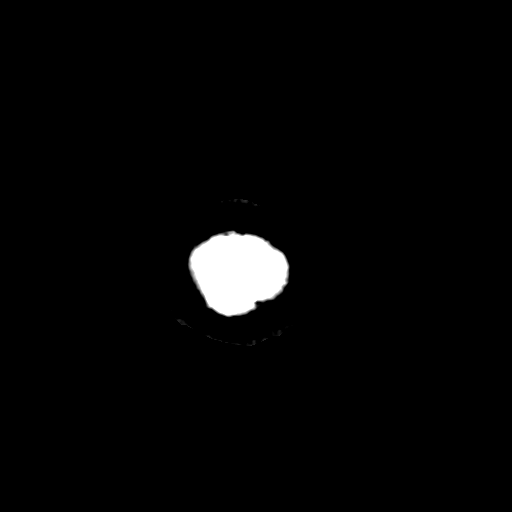

[16 of 30 positions shown; findings below may reference images not displayed]

FINDINGS: There is no intra or extra-axial fluid collection or mass lesion.
The basilar cisterns and ventricles have a normal appearance. There
is no CT evidence for acute infarction or hemorrhage. No acute
calvarial injury. Note is made of mucoperiosteal thickening within
numerous ethmoid air cells.
IMPRESSION: 1.  No evidence for acute intracranial abnormality.
2. Chronic sinusitis.

## 2017-02-04 ENCOUNTER — Encounter: Payer: Self-pay | Admitting: Allergy

## 2017-02-04 ENCOUNTER — Ambulatory Visit (INDEPENDENT_AMBULATORY_CARE_PROVIDER_SITE_OTHER): Admitting: Allergy

## 2017-02-04 VITALS — BP 108/60 | HR 78 | Temp 98.1°F | Resp 14 | Ht 59.0 in | Wt 118.0 lb

## 2017-02-04 DIAGNOSIS — Z91018 Allergy to other foods: Secondary | ICD-10-CM

## 2017-02-04 DIAGNOSIS — T781XXD Other adverse food reactions, not elsewhere classified, subsequent encounter: Secondary | ICD-10-CM

## 2017-02-04 DIAGNOSIS — J309 Allergic rhinitis, unspecified: Secondary | ICD-10-CM

## 2017-02-04 DIAGNOSIS — H1010 Acute atopic conjunctivitis, unspecified eye: Secondary | ICD-10-CM

## 2017-02-04 DIAGNOSIS — J4520 Mild intermittent asthma, uncomplicated: Secondary | ICD-10-CM

## 2017-02-04 DIAGNOSIS — L2089 Other atopic dermatitis: Secondary | ICD-10-CM

## 2017-02-04 MED ORDER — FLUTICASONE PROPIONATE 50 MCG/ACT NA SUSP: NASAL | 5 refills | Status: DC

## 2017-02-04 MED ORDER — BEPOTASTINE BESILATE 1.5 % OP SOLN: 1.0000 [drp] | Freq: Two times a day (BID) | OPHTHALMIC | 5 refills | Status: DC | PRN

## 2017-02-04 MED ORDER — AZELASTINE HCL 137 MCG/SPRAY NA SOLN: 2.0000 | Freq: Two times a day (BID) | NASAL | 5 refills | Status: DC

## 2017-02-04 MED ORDER — LEVOCETIRIZINE DIHYDROCHLORIDE 5 MG PO TABS: 5.0000 mg | ORAL_TABLET | Freq: Every evening | ORAL | 5 refills | Status: DC

## 2017-02-04 MED ORDER — EPINEPHRINE 0.3 MG/0.3ML IJ SOAJ: 0.3000 mg | Freq: Once | INTRAMUSCULAR | 1 refills | Status: AC

## 2017-02-04 MED ORDER — METHYLPREDNISOLONE ACETATE 80 MG/ML IJ SUSP
80.0000 mg | Freq: Once | INTRAMUSCULAR | Status: AC
  Administered 2017-02-04: 80 mg via INTRAMUSCULAR

## 2017-02-04 NOTE — Patient Instructions (Addendum)
Allergic rhinoconjunctivitis   - Testing today shows positives for grasses, trees, weeds, dust mites, cat, and horse.     - Change Allegra to Xyzal 5 mg daily   - You have complete nasal obstruction and provided today with Depo-Medrol injection 80 mg.   Also would advise you use Afrin nasal spray 2 sprays each nostril daily for the next 2-3 days.  After use of aspirin use should have some ability to breathe through her nose and then you should apply your nasal sprays as below:   - Flonase 2 sprays each nostril (due to nosebleeds try using Monday Wednesday Friday)   - Nasal antihistamine Azelastine 2 sprays each nostril twice a day to help with nasal drainage   - Try use of  Bepreve 1 drop each eye twice a day as needed for itchy/watery/red eyes     - Allergen immunotherapy discussed again today.  Believe you should pursue this option as you are on maximum medication therapy with continue symptoms.    History of asthma   - You have not had any significant issues with her asthma now in several years that you are well-controlled    - Would recommend having access to albuterol inhaler for as needed use in case of coughing, wheezing, shortness of breath or chest tightness.  May use 15-20 minutes prior to activity.  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  This leads to oral symptoms following ingestion. Would recommend continued avoidance of these foods.  - have access to Epipen device for as needed use in case of allergic reaction (and have on days of your allergy shots).       Eczema - Continue as needed use of triamcinolone with eczema flares - Moisturize daily with emollients like Aquaphor, Eucerin, CeraVe, Vasline  Follow-up in 3 months or sooner if needed

## 2017-02-04 NOTE — Progress Notes (Signed)
New Patient Note  RE: Angelica Edwards Intrieri MRN: [id number] DOB: 07/01/1996 Date of Office Visit: 02/04/2017  Referring provider: Oneta RackLewis, Tanika N, NP Primary care provider: Oneta RackLewis, Tanika N, NP  Chief Complaint: allergies, asthma, eczema, food allergy  History of present illness: Angelica Edwards Holstad is Edwards 21 y.o. female presenting today for consultation for atopic diseases.  She has history of allergic rhinoconjunctivitis, asthma, eczema and food allergy.  She is Edwards former patient of ours.    She reports she has been struggling with her allergies this spring and nothing has been working.   She has severe nasal congestion, itchy/watery eyes with swelling, sneezing.  Spring and summer are worse times of year for her.   She has used flonase which has caused her nose to bleed but she reports it's the only spray that has helped.  Also currently taking Allegra 180mg .   She has tried rhinocort, nasacort and other OTC nasal sprays.  She also has tried Claritin, Zyrtec, sudafed, singulair as well Patanol and OTC eye drops in the past that she did not notice any improvements with.   She was on allergen immunotherapy for about 1 month 2 years ago.  She had issues with being in school at the time and she was unable to continue.  She last had testing done about 2 years ago.    She has history of ssthma diagnosed in childhood.   She reports her last asthma flare was about 5 years ago.   She no longer has albuterol.  She denies any daytime or nighttime symptoms.    She has food allergy to grapes (mouth itch), fresh carrots (lip swelling), celery (lip swelling), watermelon (she would develop rash where the juice would come into contact with skin), banana (positive on allergy testing but never reacted), tree nuts (mouth itch and lip swelling).  Food allergy diagnosed as Edwards child.  She does not have an epinephrine device.   She has Edwards history of eczema that has become more symptomatic on arms and legs.  She uses triamcinolone  which does help.    Review of systems: Review of Systems  Constitutional: Negative for chills, fever and malaise/fatigue.  HENT: Positive for congestion and nosebleeds. Negative for ear discharge, ear pain, sinus pain, sore throat and tinnitus.   Eyes: Positive for redness. Negative for pain and discharge.  Respiratory: Negative for cough, sputum production, shortness of breath and wheezing.   Cardiovascular: Negative for chest pain.  Gastrointestinal: Negative for abdominal pain, diarrhea, heartburn, nausea and vomiting.  Musculoskeletal: Negative for joint pain and myalgias.  Skin: Positive for itching and rash.  Neurological: Negative for headaches.    All other systems negative unless noted above in HPI  Past medical history: Past Medical History:  Diagnosis Date  . Angio-edema   . Asthma   . Eczema   . Multiple allergies   . Recurrent upper respiratory infection (URI)   . Sinusitis   . Syncope   . Urticaria     Past surgical history: Past Surgical History:  Procedure Laterality Date  . NO PAST SURGERIES      Family history:  Family History  Problem Relation Age of Onset  . Allergic rhinitis Mother   . Asthma Father   . Allergic rhinitis Father   . Asthma Brother   . Asthma Brother     Social history: She lives in an apartment with carpeting with allergic heating and central cooling. There is Edwards dog in the home.  There is concern for mildew in the home. There is no concern for roaches in the home. She works as Edwards Comptroller.  She denies Edwards smoking history.   Medication List: Allergies as of 02/04/2017      Reactions   Latex Rash   For genitalia area   Motrin [ibuprofen] Nausea Only   Other    Multiple food allergies      Medication List       Accurate as of 02/04/17  3:42 PM. Always use your most recent med list.          albuterol 108 (90 Base) MCG/ACT inhaler Commonly known as:  PROVENTIL HFA;VENTOLIN HFA Inhale 2 puffs into the lungs every 6 (six)  hours as needed. For shortness of breath and wheezing   fexofenadine 180 MG tablet Commonly known as:  ALLEGRA Take 180 mg by mouth daily.   fluticasone 50 MCG/ACT nasal spray Commonly known as:  FLONASE Place 1 spray into both nostrils daily.   medroxyPROGESTERone 150 MG/ML injection Commonly known as:  DEPO-PROVERA INJECT EVERY 3 MONTHS   olopatadine 0.1 % ophthalmic solution Commonly known as:  PATANOL Place 1 drop into the right eye 2 (two) times daily. Patients mom states that she had an allergic reaction to the medication.  Medicine was prescribed for the patient.   triamcinolone cream 0.1 % Commonly known as:  KENALOG Apply Edwards small amount to the rash twice daily until clear. Limit use to 7 days.       Known medication allergies: Allergies  Allergen Reactions  . Latex Rash    For genitalia area  . Motrin [Ibuprofen] Nausea Only  . Other     Multiple food allergies     Physical examination: Blood pressure 108/60, pulse 78, temperature 98.1 F (36.7 C), temperature source Oral, resp. rate 14, height 4\' 11"  (1.499 m), weight 118 lb (53.5 kg), SpO2 98 %.  General: Alert, interactive, in no acute distress. HEENT: PERRLA, TMs pearly gray, turbinates markedly edematous with complete nasal obstruction b/l with clear discharge, post-pharynx non erythematous. Neck: Supple without lymphadenopathy. Lungs: Clear to auscultation without wheezing, rhonchi or rales. {no increased work of breathing. CV: Normal S1, S2 without murmurs. Abdomen: Nondistended, nontender. Skin: Warm and dry, without lesions or rashes. Extremities:  No clubbing, cyanosis or edema. Neuro:   Grossly intact.  Diagnositics/Labs:  Spirometry: FEV1: 2.51L  105%, FVC: 2.69L  101%, ratio consistent with Nonobstructive pattern  Allergy testing: Environmental skin prick testing positive for grasses, trees, weeds, dust mites, cat, and horse.    Food allergy skin prick testing positive for carrot, celery,  almond Allergy testing results were read and interpreted by provider, documented by clinical staff.   Assessment and plan:   Allergic rhinoconjunctivitis   - Testing today shows positives for grasses, trees, weeds, dust mites, cat, and horse.     - Change Allegra to Xyzal 5 mg daily   - You have complete nasal obstruction and provided today with Depo-Medrol injection 80 mg.   Also would advise you use Afrin nasal spray 2 sprays each nostril daily for the next 2-3 days.  After use of aspirin use should have some ability to breathe through her nose and then you should apply your nasal sprays as below:   - Flonase 2 sprays each nostril (due to nosebleeds try using Monday Wednesday Friday)   - Nasal antihistamine Azelastine 2 sprays each nostril twice Edwards day to help with nasal drainage   - Try use  of  Bepreve 1 drop each eye twice Edwards day as needed for itchy/watery/red eyes     - Allergen immunotherapy discussed again today.  Believe you should pursue this option as you are on maximum medication therapy with continue symptoms.    History of asthma   - You have not had any significant issues with her asthma now in several years that you are well-controlled    - Would recommend having access to albuterol inhaler for as needed use in case of coughing, wheezing, shortness of breath or chest tightness.  May use 15-20 minutes prior to activity.  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  This leads to oral symptoms following ingestion. Would recommend continued avoidance of these foods.  - have access to Epipen device for as needed use in case of allergic reaction (and have on days of your allergy shots).       Eczema - Continue as needed use of triamcinolone with eczema flares - Moisturize daily with emollients like  Aquaphor, Eucerin, CeraVe, Vasline  Follow-up in 3 months or sooner if needed   I appreciate the opportunity to take part in Rahmah's care. Please do not hesitate to contact me with questions.  Sincerely,   Margo Aye, MD Allergy/Immunology [facility]

## 2017-02-09 NOTE — Progress Notes (Signed)
Vials to be made 02-11-17  jm 

## 2017-02-11 DIAGNOSIS — J301 Allergic rhinitis due to pollen: Secondary | ICD-10-CM

## 2017-02-12 DIAGNOSIS — J3089 Other allergic rhinitis: Secondary | ICD-10-CM

## 2017-02-25 ENCOUNTER — Ambulatory Visit (INDEPENDENT_AMBULATORY_CARE_PROVIDER_SITE_OTHER): Admitting: *Deleted

## 2017-02-25 DIAGNOSIS — J309 Allergic rhinitis, unspecified: Secondary | ICD-10-CM

## 2017-02-25 NOTE — Progress Notes (Signed)
Immunotherapy   Patient Details  Name: Angelica Edwards MRN: [id number] Date of Birth: 10-05-1995  02/25/2017  Angelica Edwards started injections for  Pollen & Mite-Cat. Following schedule: A  Frequency:2 times per week Epi-Pen:Epi-Pen Available  Consent signed and patient instructions given. No problems after 30 minutes in the office.   Vella RedheadHeather Clark 02/25/2017, 6:30 PM

## 2017-03-04 ENCOUNTER — Ambulatory Visit (INDEPENDENT_AMBULATORY_CARE_PROVIDER_SITE_OTHER): Payer: Self-pay | Admitting: *Deleted

## 2017-03-04 DIAGNOSIS — J309 Allergic rhinitis, unspecified: Secondary | ICD-10-CM

## 2017-03-11 ENCOUNTER — Ambulatory Visit (INDEPENDENT_AMBULATORY_CARE_PROVIDER_SITE_OTHER): Payer: Self-pay

## 2017-03-11 DIAGNOSIS — J309 Allergic rhinitis, unspecified: Secondary | ICD-10-CM

## 2017-03-18 ENCOUNTER — Ambulatory Visit (INDEPENDENT_AMBULATORY_CARE_PROVIDER_SITE_OTHER): Payer: Self-pay | Admitting: *Deleted

## 2017-03-18 DIAGNOSIS — J309 Allergic rhinitis, unspecified: Secondary | ICD-10-CM

## 2017-03-24 ENCOUNTER — Ambulatory Visit (INDEPENDENT_AMBULATORY_CARE_PROVIDER_SITE_OTHER): Payer: Self-pay

## 2017-03-24 DIAGNOSIS — J309 Allergic rhinitis, unspecified: Secondary | ICD-10-CM

## 2017-04-06 ENCOUNTER — Ambulatory Visit (INDEPENDENT_AMBULATORY_CARE_PROVIDER_SITE_OTHER): Payer: Self-pay | Admitting: *Deleted

## 2017-04-06 DIAGNOSIS — J309 Allergic rhinitis, unspecified: Secondary | ICD-10-CM

## 2017-04-21 ENCOUNTER — Ambulatory Visit (INDEPENDENT_AMBULATORY_CARE_PROVIDER_SITE_OTHER): Payer: Federal, State, Local not specified - PPO | Admitting: *Deleted

## 2017-04-21 DIAGNOSIS — J309 Allergic rhinitis, unspecified: Secondary | ICD-10-CM | POA: Diagnosis not present

## 2017-05-07 ENCOUNTER — Ambulatory Visit (INDEPENDENT_AMBULATORY_CARE_PROVIDER_SITE_OTHER): Payer: Federal, State, Local not specified - PPO

## 2017-05-07 DIAGNOSIS — J309 Allergic rhinitis, unspecified: Secondary | ICD-10-CM

## 2017-05-21 ENCOUNTER — Ambulatory Visit (INDEPENDENT_AMBULATORY_CARE_PROVIDER_SITE_OTHER): Payer: Federal, State, Local not specified - PPO

## 2017-05-21 DIAGNOSIS — J309 Allergic rhinitis, unspecified: Secondary | ICD-10-CM | POA: Diagnosis not present

## 2017-06-04 ENCOUNTER — Ambulatory Visit (INDEPENDENT_AMBULATORY_CARE_PROVIDER_SITE_OTHER)

## 2017-06-04 DIAGNOSIS — J309 Allergic rhinitis, unspecified: Secondary | ICD-10-CM

## 2017-06-18 ENCOUNTER — Ambulatory Visit (INDEPENDENT_AMBULATORY_CARE_PROVIDER_SITE_OTHER): Payer: Federal, State, Local not specified - PPO

## 2017-06-18 DIAGNOSIS — J309 Allergic rhinitis, unspecified: Secondary | ICD-10-CM | POA: Diagnosis not present

## 2017-07-19 ENCOUNTER — Ambulatory Visit (INDEPENDENT_AMBULATORY_CARE_PROVIDER_SITE_OTHER): Payer: Federal, State, Local not specified - PPO | Admitting: *Deleted

## 2017-07-19 DIAGNOSIS — J309 Allergic rhinitis, unspecified: Secondary | ICD-10-CM

## 2017-12-24 ENCOUNTER — Ambulatory Visit (INDEPENDENT_AMBULATORY_CARE_PROVIDER_SITE_OTHER): Payer: Federal, State, Local not specified - PPO

## 2017-12-24 DIAGNOSIS — J309 Allergic rhinitis, unspecified: Secondary | ICD-10-CM

## 2017-12-31 ENCOUNTER — Ambulatory Visit (INDEPENDENT_AMBULATORY_CARE_PROVIDER_SITE_OTHER): Payer: Federal, State, Local not specified - PPO

## 2017-12-31 DIAGNOSIS — J309 Allergic rhinitis, unspecified: Secondary | ICD-10-CM | POA: Diagnosis not present

## 2018-01-06 NOTE — Progress Notes (Signed)
VIALS EXP 01-08-19 

## 2018-01-11 ENCOUNTER — Ambulatory Visit (INDEPENDENT_AMBULATORY_CARE_PROVIDER_SITE_OTHER): Payer: Federal, State, Local not specified - PPO | Admitting: *Deleted

## 2018-01-11 DIAGNOSIS — J309 Allergic rhinitis, unspecified: Secondary | ICD-10-CM

## 2018-07-18 ENCOUNTER — Other Ambulatory Visit: Payer: Self-pay

## 2018-07-18 ENCOUNTER — Emergency Department (HOSPITAL_BASED_OUTPATIENT_CLINIC_OR_DEPARTMENT_OTHER)
Admission: EM | Admit: 2018-07-18 | Discharge: 2018-07-18 | Disposition: A | Discharge status: Home / Self Care | Payer: Federal, State, Local not specified - PPO | Attending: Emergency Medicine | Admitting: Emergency Medicine

## 2018-07-18 ENCOUNTER — Encounter (HOSPITAL_BASED_OUTPATIENT_CLINIC_OR_DEPARTMENT_OTHER): Payer: Self-pay | Admitting: *Deleted

## 2018-07-18 DIAGNOSIS — J45909 Unspecified asthma, uncomplicated: Secondary | ICD-10-CM | POA: Insufficient documentation

## 2018-07-18 DIAGNOSIS — G43009 Migraine without aura, not intractable, without status migrainosus: Secondary | ICD-10-CM | POA: Diagnosis not present

## 2018-07-18 DIAGNOSIS — Z79899 Other long term (current) drug therapy: Secondary | ICD-10-CM | POA: Diagnosis not present

## 2018-07-18 DIAGNOSIS — R51 Headache: Secondary | ICD-10-CM | POA: Diagnosis present

## 2018-07-18 LAB — PREGNANCY, URINE: PREG TEST UR: NEGATIVE

## 2018-07-18 MED ORDER — DEXAMETHASONE SODIUM PHOSPHATE 10 MG/ML IJ SOLN
10.0000 mg | Freq: Once | INTRAMUSCULAR | Status: AC
  Administered 2018-07-18: 10 mg via INTRAVENOUS
  Filled 2018-07-18: qty 1

## 2018-07-18 MED ORDER — LACTATED RINGERS IV BOLUS
1000.0000 mL | Freq: Once | INTRAVENOUS | Status: AC
  Administered 2018-07-18: 1000 mL via INTRAVENOUS

## 2018-07-18 MED ORDER — DIPHENHYDRAMINE HCL 50 MG/ML IJ SOLN
25.0000 mg | Freq: Once | INTRAMUSCULAR | Status: AC
  Administered 2018-07-18: 25 mg via INTRAVENOUS
  Filled 2018-07-18: qty 1

## 2018-07-18 MED ORDER — PROCHLORPERAZINE EDISYLATE 10 MG/2ML IJ SOLN
10.0000 mg | Freq: Once | INTRAMUSCULAR | Status: AC
  Administered 2018-07-18: 10 mg via INTRAVENOUS
  Filled 2018-07-18: qty 2

## 2018-07-18 NOTE — ED Triage Notes (Addendum)
Headache since this am. Hx of migraines. She was driving and had to pull over and let her mom drive because the pain was bad. She also had pain in her left arm. She is able to take her jacket and sweatshirt off with no help.

## 2018-07-18 NOTE — ED Notes (Signed)
Prior to transporting pt to tx room 12, her mom states she is now having pain in her chest. EKG ordered.

## 2018-07-18 NOTE — ED Provider Notes (Signed)
[facility] EMERGENCY DEPARTMENT Provider Note   CSN: [id number] Arrival date & time: 07/18/18  1817     History   Chief Complaint Chief Complaint  Patient presents with  . Headache    HPI Angelica Edwards is a 22 y.o. female.  The history is provided by the patient.  Headache   This is a recurrent problem. The current episode started yesterday. The problem occurs constantly. The problem has been gradually worsening. The headache is associated with an unknown factor. The pain is located in the bilateral region. The quality of the pain is described as dull. The pain is at a severity of 6/10. The pain is moderate. The pain does not radiate. Associated symptoms include malaise/fatigue and chest pressure. Pertinent negatives include no anorexia, no fever, no near-syncope, no orthopnea, no palpitations, no syncope, no shortness of breath, no nausea and no vomiting. She has tried nothing for the symptoms. The treatment provided no relief.    Past Medical History:  Diagnosis Date  . Angio-edema   . Asthma   . Eczema   . Multiple allergies   . Recurrent upper respiratory infection (URI)   . Sinusitis   . Syncope   . Urticaria     There are no active problems to display for this patient.   Past Surgical History:  Procedure Laterality Date  . NO PAST SURGERIES       OB History   None      Home Medications    Prior to Admission medications   Medication Sig Start Date End Date Taking? Authorizing Provider  medroxyPROGESTERone (DEPO-PROVERA) 150 MG/ML injection INJECT EVERY 3 MONTHS 12/30/14  Yes [provider]  albuterol (PROVENTIL HFA;VENTOLIN HFA) 108 (90 BASE) MCG/ACT inhaler Inhale 2 puffs into the lungs every 6 (six) hours as needed. For shortness of breath and wheezing    [provider]  Azelastine HCl 137 MCG/SPRAY SOLN Place 2 sprays into the nose 2 (two) times daily. 02/04/17   Marcelyn BruinsPadgett, Shaylar Patricia, MD  Bepotastine Besilate 1.5 % SOLN  Place 1 drop into both eyes 2 (two) times daily as needed. 02/04/17   Marcelyn BruinsPadgett, Shaylar Patricia, MD  fexofenadine (ALLEGRA) 180 MG tablet Take 180 mg by mouth daily.    [provider]  fluticasone Aleda Grana(FLONASE) 50 MCG/ACT nasal spray 2 sprays in each nostril on Monday, Wednesday, Friday. 02/04/17   Marcelyn BruinsPadgett, Shaylar Patricia, MD  levocetirizine (XYZAL) 5 MG tablet Take 1 tablet (5 mg total) by mouth every evening. 02/04/17   Marcelyn BruinsPadgett, Shaylar Patricia, MD    Family History Family History  Problem Relation Age of Onset  . Allergic rhinitis Mother   . Asthma Father   . Allergic rhinitis Father   . Asthma Brother   . Asthma Brother     Social History Social History   Tobacco Use  . Smoking status: Never Smoker  . Smokeless tobacco: Never Used  Substance Use Topics  . Alcohol use: No  . Drug use: No     Allergies   Latex; Motrin [ibuprofen]; and Other   Review of Systems Review of Systems  Constitutional: Positive for malaise/fatigue. Negative for chills and fever.  HENT: Negative for ear pain and sore throat.   Eyes: Negative for pain and visual disturbance.  Respiratory: Negative for cough and shortness of breath.   Cardiovascular: Negative for chest pain, palpitations, orthopnea, syncope and near-syncope.  Gastrointestinal: Negative for abdominal pain, anorexia, nausea and vomiting.  Genitourinary: Negative for dysuria and hematuria.  Musculoskeletal: Negative for arthralgias and back pain.  Skin: Negative for color change and rash.  Neurological: Positive for tremors, weakness and headaches. Negative for seizures and syncope.  All other systems reviewed and are negative.    Physical Exam Updated Vital Signs  ED Triage Vitals  Enc Vitals Group     BP 07/18/18 1825 135/70     Pulse Rate 07/18/18 1825 73     Resp 07/18/18 1825 18     Temp 07/18/18 1825 98.4 F (36.9 C)     Temp Source 07/18/18 1825 Oral     SpO2 07/18/18 1825 98 %     Weight 07/18/18 1823 125 lb  (56.7 kg)     Height 07/18/18 1823 4\' 11"  (1.499 m)     Head Circumference --      Peak Flow --      Pain Score 07/18/18 1823 8     Pain Loc --      Pain Edu? --      Excl. in GC? --     Physical Exam  Constitutional: She is oriented to person, place, and time. She appears well-developed and well-nourished. She appears distressed.  HENT:  Head: Normocephalic and atraumatic.  Mouth/Throat: Oropharynx is clear and moist.  Eyes: Pupils are equal, round, and reactive to light. Conjunctivae and EOM are normal. Right eye exhibits normal extraocular motion and no nystagmus. Left eye exhibits normal extraocular motion and no nystagmus.  Neck: Normal range of motion. Neck supple.  Cardiovascular: Normal rate, regular rhythm, normal heart sounds and intact distal pulses.  No murmur heard. Pulmonary/Chest: Effort normal and breath sounds normal. No respiratory distress.  Abdominal: Soft. There is no tenderness.  Musculoskeletal: She exhibits no edema.  Neurological: She is alert and oriented to person, place, and time. She has normal strength. She is not disoriented. No cranial nerve deficit or sensory deficit. Coordination and gait normal.  Skin: Skin is warm and dry.  Psychiatric: She has a normal mood and affect. Her behavior is normal.  Nursing note and vitals reviewed.    ED Treatments / Results  Labs (all labs ordered are listed, but only abnormal results are displayed) Labs Reviewed  PREGNANCY, URINE    EKG EKG Interpretation  Date/Time:  Monday July 18 2018 19:40:25 EST Ventricular Rate:  59 PR Interval:    QRS Duration: 77 QT Interval:  392 QTC Calculation: 389 R Axis:   56 Text Interpretation:  Sinus rhythm Confirmed by Virgina Norfolk [phone]) on 07/18/2018 9:27:59 PM   Radiology No results found.  Procedures Procedures (including critical care time)  Medications Ordered in ED Medications  lactated ringers bolus 1,000 mL ( Intravenous Stopped 07/18/18 2057)    prochlorperazine (COMPAZINE) injection 10 mg (10 mg Intravenous Given 07/18/18 1956)  diphenhydrAMINE (BENADRYL) injection 25 mg (25 mg Intravenous Given 07/18/18 1954)  dexamethasone (DECADRON) injection 10 mg (10 mg Intravenous Given 07/18/18 2044)     Initial Impression / Assessment and Plan / ED Course  I have reviewed the triage vital signs and the nursing notes.  Pertinent labs & imaging results that were available during my care of the patient were reviewed by me and considered in my medical decision making (see chart for details).     Angelica Edwards is a 22 year old female w/ history of migraines who presents to the ED with headache.  Patient with normal vitals.  No fever.  Patient states migraine type headache today that slowly is gotten worse over the day.  Patient with some pain into her chest region, paresthesias of her left arm, scotomas.  Patient feels like headache is about a 6.  She has had photophobia made it difficult to drive.  Overall sounds like patient has complex migraine.  EKG shows sinus rhythm.  No concern for cardiac process.  Patient has normal neurological exam.  No concern for intracranial process.  No concern for meningitis.  Patient was given headache cocktail with Compazine, Benadryl, Decadron, IV fluids she states that headache completely has gone away.  Remained neurologically intact throughout my care.  Given information to follow-up with [facility] neurology.  She used to follow-up with the pediatric neurologist but has not seen a neurologist in several years.  Believe patient would benefit from daily headache medication as she appears to have multiple migraine attacks a month.  Patient given return precautions and discharged from ED in good condition.  This chart was dictated using voice recognition software.  Despite best efforts to proofread,  errors can occur which can change the documentation meaning.   Final Clinical Impressions(s) / ED Diagnoses   Final  diagnoses:  Migraine without aura and without status migrainosus, not intractable    ED Discharge Orders    None       Virgina Norfolk, DO 07/18/18 2131

## 2018-09-22 ENCOUNTER — Encounter: Payer: Self-pay | Admitting: Neurology

## 2018-09-22 ENCOUNTER — Ambulatory Visit (INDEPENDENT_AMBULATORY_CARE_PROVIDER_SITE_OTHER): Payer: Commercial Managed Care - PPO | Admitting: Neurology

## 2018-09-22 VITALS — BP 112/62 | HR 72 | Ht 59.0 in | Wt 128.0 lb

## 2018-09-22 DIAGNOSIS — IMO0002 Reserved for concepts with insufficient information to code with codable children: Secondary | ICD-10-CM | POA: Insufficient documentation

## 2018-09-22 DIAGNOSIS — G43709 Chronic migraine without aura, not intractable, without status migrainosus: Secondary | ICD-10-CM | POA: Diagnosis not present

## 2018-09-22 DIAGNOSIS — R404 Transient alteration of awareness: Secondary | ICD-10-CM

## 2018-09-22 DIAGNOSIS — R413 Other amnesia: Secondary | ICD-10-CM | POA: Insufficient documentation

## 2018-09-22 MED ORDER — RIZATRIPTAN BENZOATE 10 MG PO TBDP: 10.0000 mg | ORAL_TABLET | ORAL | 6 refills | Status: AC | PRN

## 2018-09-22 MED ORDER — NORTRIPTYLINE HCL 25 MG PO CAPS: 50.0000 mg | ORAL_CAPSULE | Freq: Every day | ORAL | 11 refills | Status: AC

## 2018-09-22 NOTE — Progress Notes (Addendum)
PATIENT: Angelica Edwards DOB: 05/25/1996  Chief Complaint  Patient presents with  . Migraine    She is here with her mother, Angelica Edwards. History of childhood migraines and was previously treated by a pediatric neurologist.  She has never been on any preventive or rescue medications. She was treated in ED on 07/18/18 for a severe event that was relieved with IV medications.   She now estimates 2-3 migraines weekly.  She does not treat the pain with medication.  She just lays down to rest.  . Passing Out Events    MMSE 29/30 - 13 animals.  Reports history of passing out events with the most recent being sometime last year.  She feels dizzy, lightheaded, feels hot and has a hard time keeping her eyes open prior to passing.  Due to these events,  she has suffered 2-3 concussions.  She is concerned about her memory. She has been evaluated by a cardiologist but no underlying cause was determined.   Marland Kitchen PCP    Angelica Rack, NP     [name] is a 23 years old female, seen in request by her primary care nurse practitioner Angelica Edwards, for evaluation of passing out spells, migraine headache, initial evaluation was on September 22, 2018, she is accompanied by her mother at today's visit  I have reviewed and summarized the referring note from the referring physician and the emergency room visit on July 18, 2018, when she presented with gradual worsening severe migraine headaches, responded very well to IV cocktail of Compazine 10 mg, Benadryl 25 mg, Decadron 10 mg  She reported history of migraine headache since middle school, no migraine are lateralized severe pounding headache with associated light noise sensitivity, nauseous, mostly on the right side, lasting 2 to 3 hours, sometimes few days, he has tried over-the-counter NSAIDs, Excedrin Migraine with limited help,  She has gradual worsening migraine since 2019, she is having migraine 2-3 each week, trigger for her migraine  stress, sleep deprivation, she also complains of depression, difficulty sleeping, trazodone without helping her symptoms,  She also has intermittent passing out spells, initial episode was at age 76, she was at school, missing her breakfast, on standing for more than 1 hour looking for demonstration, she felt heart palpitation., tunnel vision, going to faint, she had transient loss of consciousness, there was no seizure activity,  Later spells are most associated with taking a shower after full stomach, with transient loss of consciousness, but no seizure-like activity,  Personally reviewed CT head without contrast in May 2016 that was normal with exception of mild chronic sinusitis   REVIEW OF SYSTEMS: Full 14 system review of systems performed and notable only for fatigue, ringing in ears, spinning sensation, eye pain, feeling hot, headaches, dizziness, passing out, insomnia, sleepiness, depression, not enough sleep, decreased energy, racing thoughts All other review of systems were negative.  ALLERGIES: Allergies  Allergen Reactions  . Latex Rash    For genitalia area  . Motrin [Ibuprofen] Nausea Only  . Other     Multiple food allergies (tree nuts, carrots, lettuce, watermelons, bananas)    HOME MEDICATIONS: Current Outpatient Medications  Medication Sig Dispense Refill  . albuterol (PROVENTIL HFA;VENTOLIN HFA) 108 (90 BASE) MCG/ACT inhaler Inhale 2 puffs into the lungs every 6 (six) hours as needed. For shortness of breath and wheezing    . fexofenadine (ALLEGRA) 180 MG tablet Take 180 mg by mouth as needed.  No current facility-administered medications for this visit.     PAST MEDICAL HISTORY: Past Medical History:  Diagnosis Date  . Angio-edema   . Asthma   . Depression   . Eczema   . History of concussion   . Migraines   . Multiple allergies   . Recurrent upper respiratory infection (URI)   . Sinusitis   . Syncope   . Syncope and collapse   . Urticaria      PAST SURGICAL HISTORY: Past Surgical History:  Procedure Laterality Date  . NO PAST SURGERIES      FAMILY HISTORY: Family History  Problem Relation Age of Onset  . Allergic rhinitis Mother   . Other Mother        CRPS  . Asthma Father   . Allergic rhinitis Father   . Hypertension Father   . Asthma Brother   . Seizures Brother        as a baby only  . Stroke Brother        during delivery  . Asthma Brother   . Stroke Maternal Grandfather   . Heart disease Maternal Grandfather   . Lung cancer Paternal Grandfather   . Dementia Paternal Grandfather     SOCIAL HISTORY: Social History   Socioeconomic History  . Marital status: Single    Spouse name: Not on file  . Number of children: 0  . Years of education: college  . Highest education level: Bachelor's degree (e.g., BA, AB, BS)  Occupational History  . Occupation: [employer]  Social Needs  . Financial resource strain: Not on file  . Food insecurity:    Worry: Not on file    Inability: Not on file  . Transportation needs:    Medical: Not on file    Non-medical: Not on file  Tobacco Use  . Smoking status: Never Smoker  . Smokeless tobacco: Never Used  Substance and Sexual Activity  . Alcohol use: Yes    Comment: occasional  . Drug use: No  . Sexual activity: Never  Lifestyle  . Physical activity:    Days per week: Not on file    Minutes per session: Not on file  . Stress: Not on file  Relationships  . Social connections:    Talks on phone: Not on file    Gets together: Not on file    Attends religious service: Not on file    Active member of club or organization: Not on file    Attends meetings of clubs or organizations: Not on file    Relationship status: Not on file  . Intimate partner violence:    Fear of current or ex partner: Not on file    Emotionally abused: Not on file    Physically abused: Not on file    Forced sexual activity: Not on file  Other Topics Concern  . Not on file  Social  History Narrative   Lives with roommates.   Left-handed.   2 cups caffeine per day.     PHYSICAL EXAM   Vitals:   09/22/18 1005  BP: 112/62  Pulse: 72  Weight: 128 lb (58.1 kg)  Height: 4\' 11"  (1.499 m)    Not recorded      Body mass index is 25.85 kg/m.  PHYSICAL EXAMNIATION:  Gen: NAD, conversant, well nourised, obese, well groomed                     Cardiovascular: Regular rate rhythm, no  peripheral edema, warm, nontender. Eyes: Conjunctivae clear without exudates or hemorrhage Neck: Supple, no carotid bruits. Pulmonary: Clear to auscultation bilaterally   NEUROLOGICAL EXAM:  MMSE - Mini Mental State Exam 09/22/2018  Orientation to time 5  Orientation to Place 5  Registration 3  Attention/ Calculation 5  Recall 2  Language- name 2 objects 2  Language- repeat 1  Language- follow 3 step command 3  Language- read & follow direction 1  Write a sentence 1  Copy design 1  Total score 29  animal naming 14    CRANIAL NERVES: CN II: Visual fields are full to confrontation. Fundoscopic exam is normal with sharp discs and no vascular changes. Pupils are round equal and briskly reactive to light. CN III, IV, VI: extraocular movement are normal. No ptosis. CN V: Facial sensation is intact to pinprick in all 3 divisions bilaterally. Corneal responses are intact.  CN VII: Face is symmetric with normal eye closure and smile. CN VIII: Hearing is normal to rubbing fingers CN IX, X: Palate elevates symmetrically. Phonation is normal. CN XI: Head turning and shoulder shrug are intact CN XII: Tongue is midline with normal movements and no atrophy.  MOTOR: There is no pronator drift of out-stretched arms. Muscle bulk and tone are normal. Muscle strength is normal.  REFLEXES: Reflexes are 2+ and symmetric at the biceps, triceps, knees, and ankles. Plantar responses are flexor.  SENSORY: Intact to light touch, pinprick, positional sensation and vibratory sensation are  intact in fingers and toes.  COORDINATION: Rapid alternating movements and fine finger movements are intact. There is no dysmetria on finger-to-nose and heel-knee-shin.    GAIT/STANCE: Posture is normal. Gait is steady with normal steps, base, arm swing, and turning. Heel and toe walking are normal. Tandem gait is normal.  Romberg is absent.   DIAGNOSTIC DATA (LABS, IMAGING, TESTING) - I reviewed patient records, labs, notes, testing and imaging myself where available.   ASSESSMENT AND PLAN  Angelica KinsKiara A Archambeault is a 23 y.o. female   Chronic migraine headaches Memory loss  Likely related to her mood disorder  Start preventive medications nortriptyline titrating to 25 mg 2 tablets every night  Maxalt as needed  MRI of the brain to rule out structural lesion  Laboratory evaluations including TSH, B12  Passing out spells,  Most suggestive of syncope   Angelica Edwards, M.D. Ph.D.  [facility] [address], [address] [place], [place] [address] Ph: [phone] Fax: [phone]  CC: Angelica RackLewis, Angelica N, NP

## 2018-09-23 ENCOUNTER — Telehealth: Payer: Self-pay | Admitting: Neurology

## 2018-09-23 LAB — COMPREHENSIVE METABOLIC PANEL
ALBUMIN: 4.3 g/dL (ref 3.9–5.0)
ALT: 30 IU/L (ref 0–32)
AST: 24 IU/L (ref 0–40)
Albumin/Globulin Ratio: 1.6 (ref 1.2–2.2)
Alkaline Phosphatase: 57 IU/L (ref 39–117)
BUN / CREAT RATIO: 18 (ref 9–23)
BUN: 14 mg/dL (ref 6–20)
Bilirubin Total: 0.6 mg/dL (ref 0.0–1.2)
CALCIUM: 10 mg/dL (ref 8.7–10.2)
CHLORIDE: 102 mmol/L (ref 96–106)
CO2: 19 mmol/L — AB (ref 20–29)
Creatinine, Ser: 0.77 mg/dL (ref 0.57–1.00)
GFR calc non Af Amer: 109 mL/min/{1.73_m2} (ref >59)
GFR, EST AFRICAN AMERICAN: 126 mL/min/{1.73_m2} (ref >59)
GLOBULIN, TOTAL: 2.7 g/dL (ref 1.5–4.5)
Glucose: 78 mg/dL (ref 65–99)
POTASSIUM: 4.4 mmol/L (ref 3.5–5.2)
Sodium: 139 mmol/L (ref 134–144)
TOTAL PROTEIN: 7 g/dL (ref 6.0–8.5)

## 2018-09-23 LAB — CBC WITH DIFFERENTIAL/PLATELET
Basophils Absolute: 0 10*3/uL (ref 0.0–0.2)
Basos: 0 %
EOS (ABSOLUTE): 0.1 10*3/uL (ref 0.0–0.4)
EOS: 2 %
HEMATOCRIT: 39.3 % (ref 34.0–46.6)
HEMOGLOBIN: 13.2 g/dL (ref 11.1–15.9)
IMMATURE GRANS (ABS): 0 10*3/uL (ref 0.0–0.1)
IMMATURE GRANULOCYTES: 0 %
Lymphocytes Absolute: 1.2 10*3/uL (ref 0.7–3.1)
Lymphs: 26 %
MCH: 31.5 pg (ref 26.6–33.0)
MCHC: 33.6 g/dL (ref 31.5–35.7)
MCV: 94 fL (ref 79–97)
MONOCYTES: 10 %
Monocytes Absolute: 0.5 10*3/uL (ref 0.1–0.9)
NEUTROS PCT: 62 %
Neutrophils Absolute: 2.8 10*3/uL (ref 1.4–7.0)
Platelets: 264 10*3/uL (ref 150–450)
RBC: 4.19 x10E6/uL (ref 3.77–5.28)
RDW: 12.6 % (ref 11.7–15.4)
WBC: 4.6 10*3/uL (ref 3.4–10.8)

## 2018-09-23 LAB — TSH: TSH: 1.11 u[IU]/mL (ref 0.450–4.500)

## 2018-09-23 NOTE — Telephone Encounter (Signed)
MR Brain wo contrast Dr. Binnie Kand Auth: NPR Ref # Vela Prose & [employer] Fed autH: [place] Ref # [id number]. Patient is scheduled for 09/28/18

## 2018-09-27 ENCOUNTER — Ambulatory Visit (INDEPENDENT_AMBULATORY_CARE_PROVIDER_SITE_OTHER): Payer: Commercial Managed Care - PPO | Admitting: Neurology

## 2018-09-27 DIAGNOSIS — R55 Syncope and collapse: Secondary | ICD-10-CM | POA: Diagnosis not present

## 2018-09-27 DIAGNOSIS — IMO0002 Reserved for concepts with insufficient information to code with codable children: Secondary | ICD-10-CM

## 2018-09-27 DIAGNOSIS — R404 Transient alteration of awareness: Secondary | ICD-10-CM

## 2018-09-27 DIAGNOSIS — G43709 Chronic migraine without aura, not intractable, without status migrainosus: Secondary | ICD-10-CM

## 2018-09-28 ENCOUNTER — Ambulatory Visit (INDEPENDENT_AMBULATORY_CARE_PROVIDER_SITE_OTHER): Payer: Commercial Managed Care - PPO

## 2018-09-28 DIAGNOSIS — IMO0002 Reserved for concepts with insufficient information to code with codable children: Secondary | ICD-10-CM

## 2018-09-28 DIAGNOSIS — G43709 Chronic migraine without aura, not intractable, without status migrainosus: Secondary | ICD-10-CM | POA: Diagnosis not present

## 2018-09-28 DIAGNOSIS — R404 Transient alteration of awareness: Secondary | ICD-10-CM | POA: Diagnosis not present

## 2018-10-07 NOTE — Procedures (Signed)
   HISTORY: 23 year old female, with history of migraine headaches, passing out spells,  TECHNIQUE:  This is a routine 16 channel EEG recording with one channel devoted to a limited EKG recording.  It was performed during wakefulness, drowsiness and asleep.  Hyperventilation and photic stimulation were performed as activating procedures.  There are minimum muscle and movement artifact noted.  Upon maximum arousal, posterior dominant waking rhythm consistent of rhythmic alpha range activity, with frequency of 10 hz. Activities are symmetric over the bilateral posterior derivations and attenuated with eye opening.  Hyperventilation produced mild/moderate buildup with higher amplitude and the slower activities noted.  Photic stimulation did not alter the tracing.  During EEG recording, patient developed drowsiness and no deeper stage of sleep was achieved.  During EEG recording, there was no epileptiform discharge noted.  EKG demonstrate sinus rhythm, with heart rate of 102 beats per min.  CONCLUSION: This is a  normal awake EEG.  There is no electrodiagnostic evidence of epileptiform discharge.  Levert Feinstein, M.D. Ph.D.  [facility] [address] [place], [place] [address] Phone: [phone] Fax:      [phone]

## 2018-10-10 ENCOUNTER — Telehealth: Payer: Self-pay | Admitting: Neurology

## 2018-10-10 NOTE — Telephone Encounter (Signed)
Pt is calling needing her MRI and EEG results. Please advise.

## 2018-10-10 NOTE — Telephone Encounter (Signed)
Spoke with pt. and explained MRI brain and EEG were both normal. She verbalized understanding of same/fim

## 2018-12-20 ENCOUNTER — Telehealth: Payer: Self-pay

## 2018-12-20 NOTE — Telephone Encounter (Signed)
Spoke with the patient and she has given verbal consent to do a webex visit with Maralyn Sago and to file her insurance. E-mail has been confirmed and sent.

## 2018-12-22 ENCOUNTER — Other Ambulatory Visit: Payer: Self-pay

## 2018-12-22 ENCOUNTER — Ambulatory Visit: Payer: Federal, State, Local not specified - PPO | Admitting: Neurology

## 2018-12-22 NOTE — Progress Notes (Deleted)
Virtual Visit via Video Note  I connected with Angelica Edwards on 12/22/18 at  3:45 PM EDT by a video enabled telemedicine application and verified that I am speaking with the correct person using two identifiers.   I discussed the limitations of evaluation and management by telemedicine and the availability of in person appointments. The patient expressed understanding and agreed to proceed.  History of Present Illness: 09/22/2018 Dr. Terrace Arabia: Angelica Edwards is a 23 years old female, seen in request by her primary care nurse practitioner Oneta Rack, for evaluation of passing out spells, migraine headache, initial evaluation was on September 22, 2018, she is accompanied by her mother at today's visit  I have reviewed and summarized the referring note from the referring physician and the emergency room visit on July 18, 2018, when she presented with gradual worsening severe migraine headaches, responded very well to IV cocktail of Compazine 10 mg, Benadryl 25 mg, Decadron 10 mg  She reported history of migraine headache since middle school, no migraine are lateralized severe pounding headache with associated light noise sensitivity, nauseous, mostly on the right side, lasting 2 to 3 hours, sometimes few days, he has tried over-the-counter NSAIDs, Excedrin Migraine with limited help,  She has gradual worsening migraine since 2019, she is having migraine 2-3 each week, trigger for her migraine stress, sleep deprivation, she also complains of depression, difficulty sleeping, trazodone without helping her symptoms,  She also has intermittent passing out spells, initial episode was at age 27, she was at school, missing her breakfast, on standing for more than 1 hour looking for demonstration, she felt heart palpitation., tunnel vision, going to faint, she had transient loss of consciousness, there was no seizure activity,  Later spells are most associated with taking a shower after full stomach, with  transient loss of consciousness, but no seizure-like activity,  Personally reviewed CT head without contrast in May 2016 that was normal with exception of mild chronic sinusitis  12/22/2018 SS: History of chronic migraine headaches, memory loss, panic spells.  At her last visit she was started on nortriptyline titrating to 50 mg at bedtime, Maxalt as needed for migraine headache.  She had a normal EEG and MRI of the brain. Laboratory evaluation in January 2020 was normal, including TSH.   Observations/Objective:   Assessment and Plan:   Follow Up Instructions:    I discussed the assessment and treatment plan with the patient. The patient was provided an opportunity to ask questions and all were answered. The patient agreed with the plan and demonstrated an understanding of the instructions.   The patient was advised to call back or seek an in-person evaluation if the symptoms worsen or if the condition fails to improve as anticipated.  I provided *** minutes of non-face-to-face time during this encounter.   Glean Salvo, NP
